# Patient Record
Sex: Female | Born: 1970 | Race: Black or African American | Hispanic: No | Marital: Single | Smoking: Never smoker
Health system: Southern US, Community
[De-identification: ages and names within clinical notes are randomized; demographics above are authoritative.]

## PROBLEM LIST (undated history)

## (undated) DIAGNOSIS — R32 Unspecified urinary incontinence: Secondary | ICD-10-CM

## (undated) DIAGNOSIS — K219 Gastro-esophageal reflux disease without esophagitis: Secondary | ICD-10-CM

## (undated) DIAGNOSIS — G459 Transient cerebral ischemic attack, unspecified: Secondary | ICD-10-CM

## (undated) DIAGNOSIS — E039 Hypothyroidism, unspecified: Secondary | ICD-10-CM

## (undated) DIAGNOSIS — F32A Depression, unspecified: Secondary | ICD-10-CM

## (undated) DIAGNOSIS — G4733 Obstructive sleep apnea (adult) (pediatric): Secondary | ICD-10-CM

## (undated) DIAGNOSIS — E0500 Thyrotoxicosis with diffuse goiter without thyrotoxic crisis or storm: Secondary | ICD-10-CM

## (undated) DIAGNOSIS — I517 Cardiomegaly: Secondary | ICD-10-CM

## (undated) DIAGNOSIS — F419 Anxiety disorder, unspecified: Secondary | ICD-10-CM

## (undated) DIAGNOSIS — K859 Acute pancreatitis without necrosis or infection, unspecified: Secondary | ICD-10-CM

## (undated) DIAGNOSIS — F4310 Post-traumatic stress disorder, unspecified: Secondary | ICD-10-CM

## (undated) DIAGNOSIS — M1990 Unspecified osteoarthritis, unspecified site: Secondary | ICD-10-CM

## (undated) DIAGNOSIS — N6032 Fibrosclerosis of left breast: Secondary | ICD-10-CM

---

## 1995-05-31 HISTORY — PX: BREAST REDUCTION SURGERY: SHX8

## 1995-05-31 HISTORY — PX: LIPOSUCTION: SHX10

## 1996-05-30 HISTORY — PX: CHOLECYSTECTOMY: SHX55

## 2002-06-30 DIAGNOSIS — E0500 Thyrotoxicosis with diffuse goiter without thyrotoxic crisis or storm: Secondary | ICD-10-CM | POA: Insufficient documentation

## 2015-05-31 HISTORY — PX: KNEE ARTHROSCOPY: SUR90

## 2018-12-15 ENCOUNTER — Emergency Department (HOSPITAL_COMMUNITY)
Admission: EM | Admit: 2018-12-15 | Discharge: 2018-12-15 | Disposition: A | Discharge status: Home / Self Care | Payer: No Typology Code available for payment source | Attending: Emergency Medicine | Admitting: Emergency Medicine

## 2018-12-15 ENCOUNTER — Encounter (HOSPITAL_COMMUNITY): Payer: Self-pay | Admitting: Emergency Medicine

## 2018-12-15 ENCOUNTER — Emergency Department (HOSPITAL_COMMUNITY): Payer: No Typology Code available for payment source

## 2018-12-15 ENCOUNTER — Other Ambulatory Visit: Payer: Self-pay

## 2018-12-15 DIAGNOSIS — R0602 Shortness of breath: Secondary | ICD-10-CM | POA: Diagnosis not present

## 2018-12-15 DIAGNOSIS — R7989 Other specified abnormal findings of blood chemistry: Secondary | ICD-10-CM

## 2018-12-15 DIAGNOSIS — R002 Palpitations: Secondary | ICD-10-CM

## 2018-12-15 HISTORY — DX: Thyrotoxicosis with diffuse goiter without thyrotoxic crisis or storm: E05.00

## 2018-12-15 HISTORY — DX: Unspecified osteoarthritis, unspecified site: M19.90

## 2018-12-15 LAB — CBC
HCT: 42.1 % (ref 36.0–46.0)
Hemoglobin: 13.6 g/dL (ref 12.0–15.0)
MCH: 30.8 pg (ref 26.0–34.0)
MCHC: 32.3 g/dL (ref 30.0–36.0)
MCV: 95.5 fL (ref 80.0–100.0)
Platelets: 336 10*3/uL (ref 150–400)
RBC: 4.41 MIL/uL (ref 3.87–5.11)
RDW: 14.8 % (ref 11.5–15.5)
WBC: 7.8 10*3/uL (ref 4.0–10.5)
nRBC: 0 % (ref 0.0–0.2)

## 2018-12-15 LAB — BASIC METABOLIC PANEL
Anion gap: 9 (ref 5–15)
BUN: 8 mg/dL (ref 6–20)
CO2: 18 mmol/L — ABNORMAL LOW (ref 22–32)
Calcium: 9.3 mg/dL (ref 8.9–10.3)
Chloride: 109 mmol/L (ref 98–111)
Creatinine, Ser: 1.04 mg/dL — ABNORMAL HIGH (ref 0.44–1.00)
GFR calc Af Amer: >60 mL/min (ref >60)
GFR calc non Af Amer: >60 mL/min (ref >60)
Glucose, Bld: 106 mg/dL — ABNORMAL HIGH (ref 70–99)
Potassium: 3.7 mmol/L (ref 3.5–5.1)
Sodium: 136 mmol/L (ref 135–145)

## 2018-12-15 LAB — TROPONIN I (HIGH SENSITIVITY)
Troponin I (High Sensitivity): 3 ng/L (ref <18)
Troponin I (High Sensitivity): <2 ng/L (ref <18)

## 2018-12-15 LAB — TSH: TSH: 37.444 u[IU]/mL — ABNORMAL HIGH (ref 0.350–4.500)

## 2018-12-15 LAB — I-STAT BETA HCG BLOOD, ED (MC, WL, AP ONLY): I-stat hCG, quantitative: <5 m[IU]/mL (ref <5)

## 2018-12-15 LAB — D-DIMER, QUANTITATIVE: D-Dimer, Quant: 0.63 ug/mL-FEU — ABNORMAL HIGH (ref 0.00–0.50)

## 2018-12-15 LAB — BRAIN NATRIURETIC PEPTIDE: B Natriuretic Peptide: 13.6 pg/mL (ref 0.0–100.0)

## 2018-12-15 LAB — T4, FREE: Free T4: 0.53 ng/dL — ABNORMAL LOW (ref 0.61–1.12)

## 2018-12-15 IMAGING — DX CHEST - 2 VIEW
2 series · 2 of 2 positions shown · non-contrast
Comparison: None.

CLINICAL DATA: Shortness of breath since yesterday. Bilateral leg
swelling for the past 2 weeks. Palpitations for the past week.

EXAM:
CHEST - 2 VIEW

[chest pa]
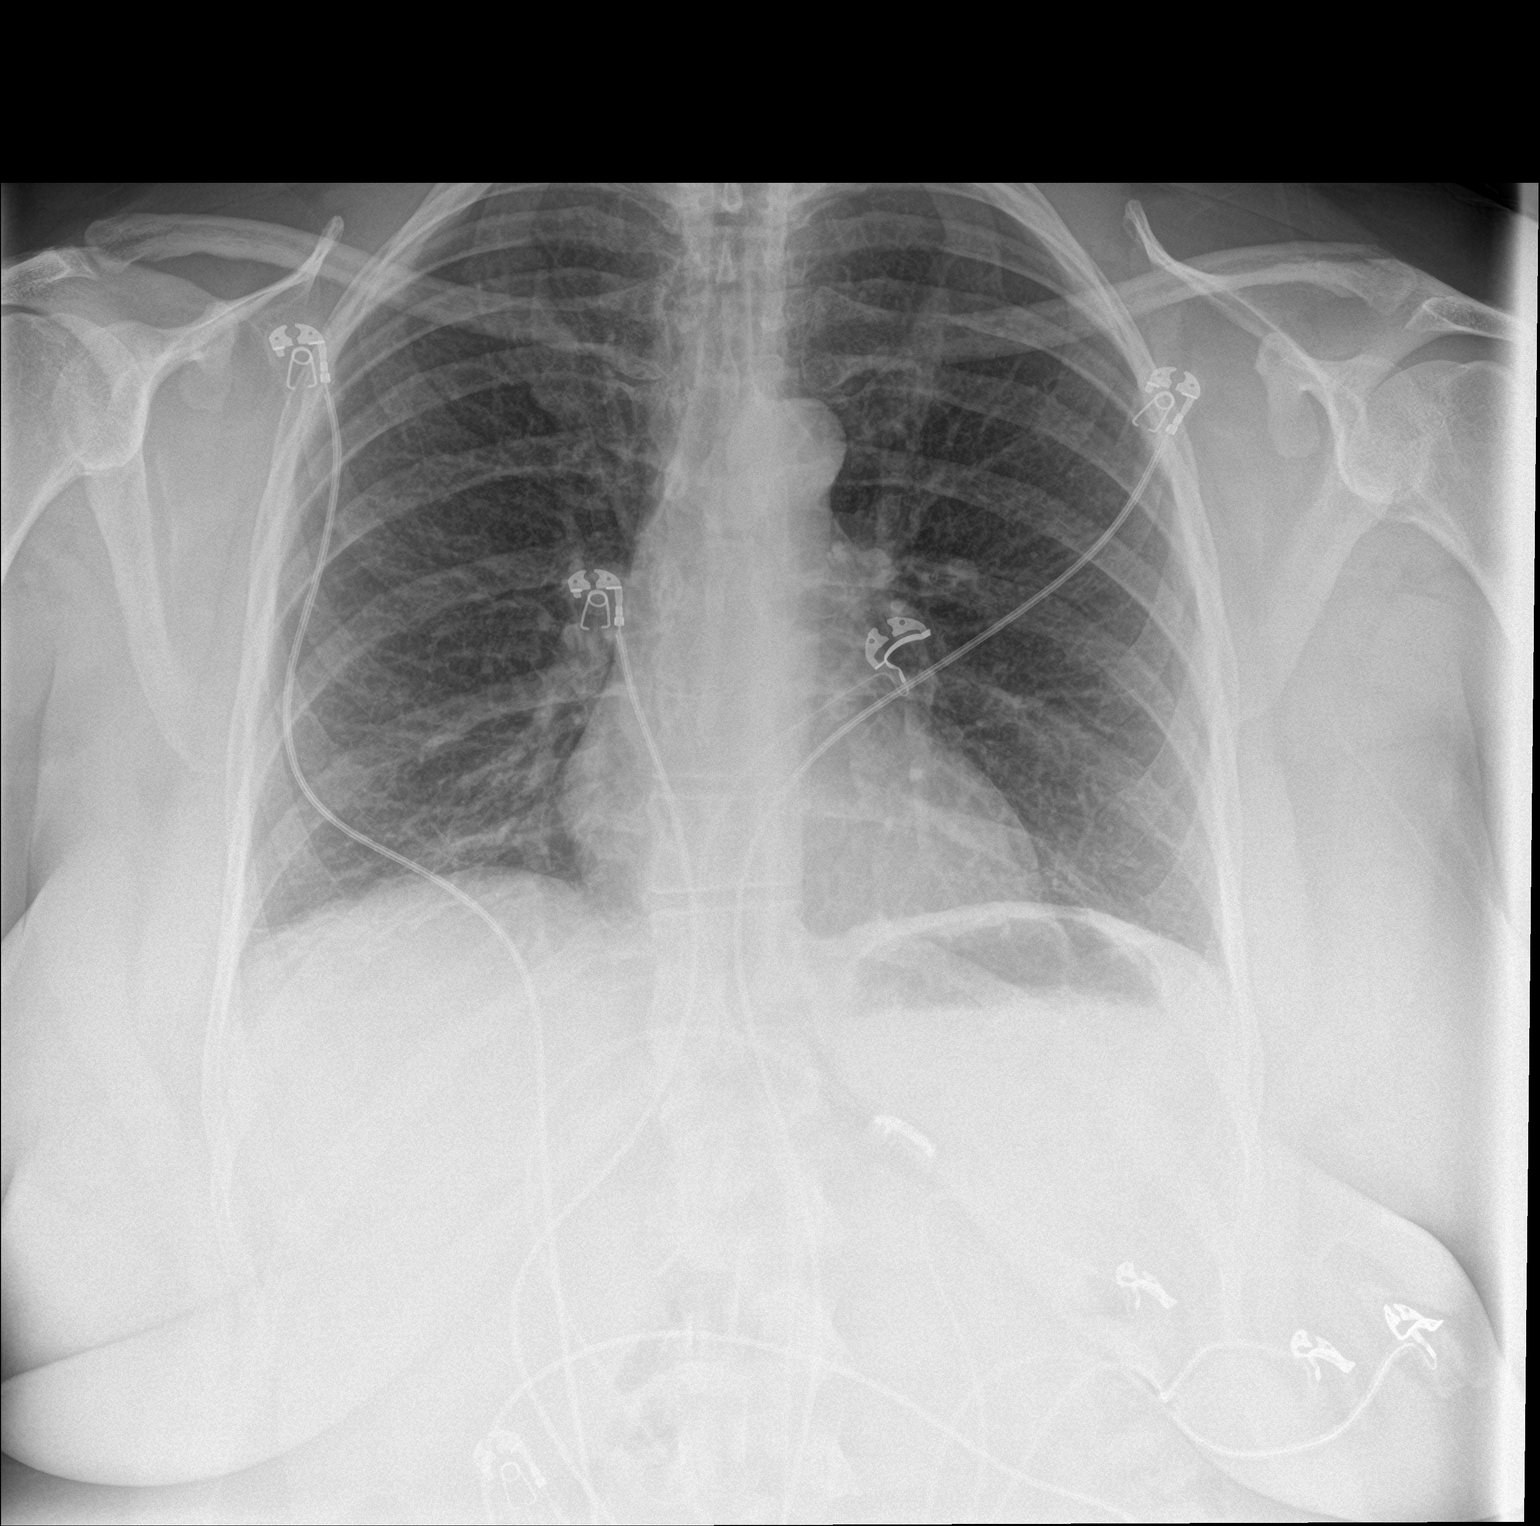

[chest lat]
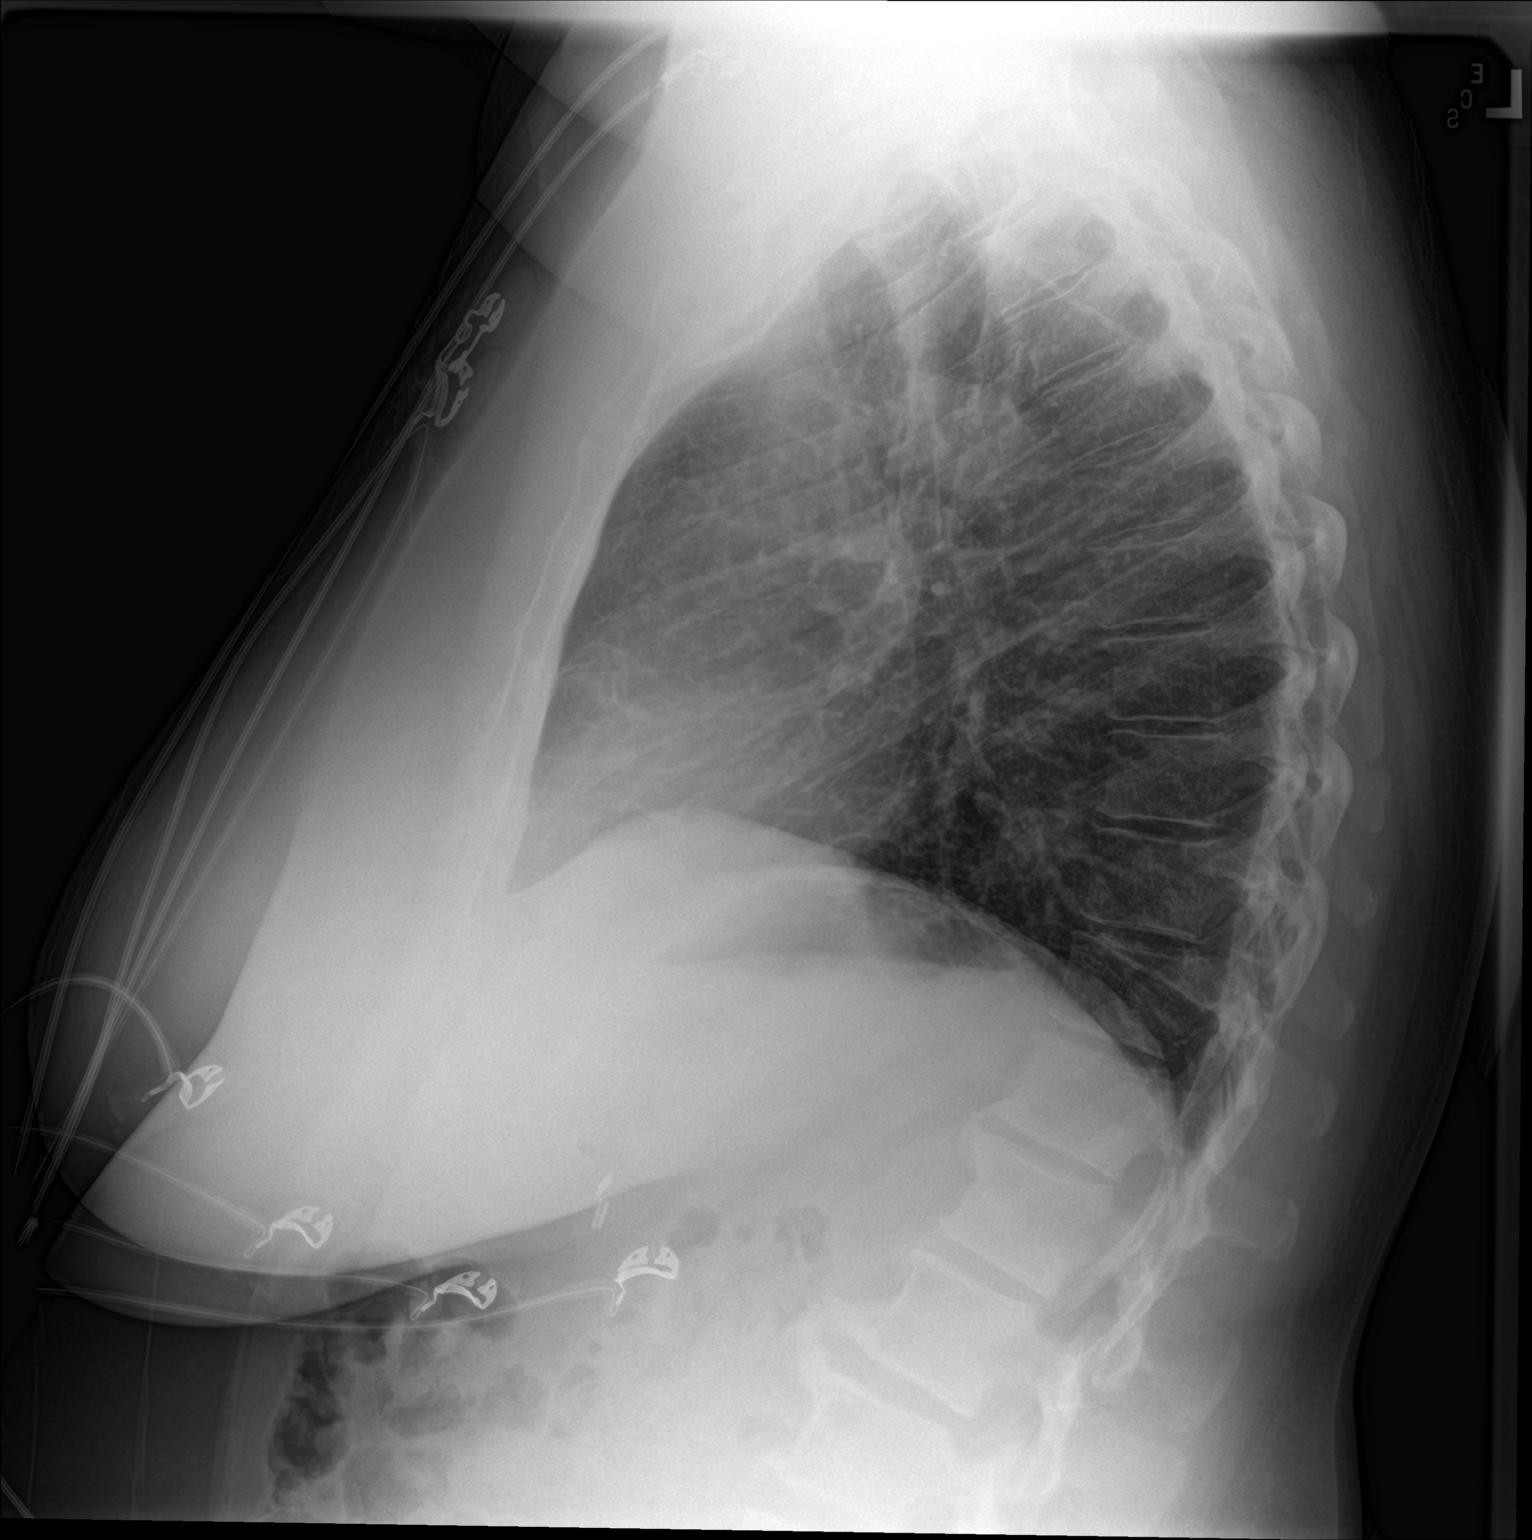

[2 of 2 positions shown; findings below may reference images not displayed]

FINDINGS: Normal sized heart. Minimal linear atelectasis or scarring at the
right lung base. Otherwise, clear lungs with normal vascularity.
Minimal diffuse peribronchial thickening and accentuation of the
interstitial markings. Normal vascularity. No pleural fluid. Minimal
scoliosis. Upper abdominal surgical clips.
IMPRESSION: Minimal bronchitic changes and minimal linear atelectasis or
scarring at the right lung base.

## 2018-12-15 IMAGING — CT CT ANGIOGRAPHY CHEST
2 of 6 series · 19 of 46 positions shown · IV contrast (omnipaque)
Comparison: Chest x-ray today.

CLINICAL DATA: Shortness of breath since yesterday worse with
walking. Some bilateral lower extremity edema.

EXAM:
CT ANGIOGRAPHY CHEST WITH CONTRAST
TECHNIQUE: Multidetector CT imaging of the chest was performed using the
standard protocol during bolus administration of intravenous
contrast. Multiplanar CT image reconstructions and MIPs were
obtained to evaluate the vascular anatomy.
CONTRAST:  100mL OMNIPAQUE IOHEXOL 350 MG/ML SOLN

[Series 6: thins · axial · 0.76mm/px · z∈[+1113,+1374]mm · 16 of 287 slices shown]
[im 13/287  lung]
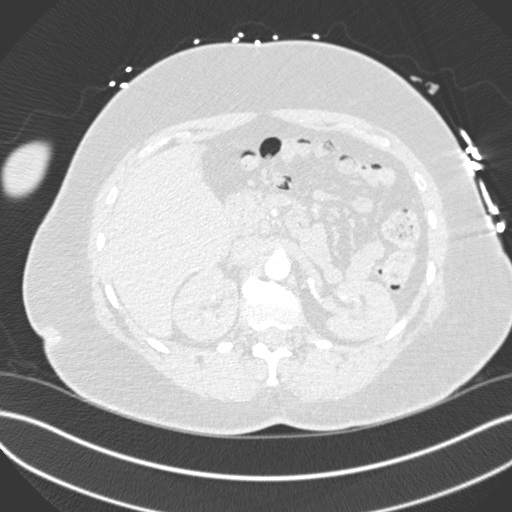
[im 38/287  soft-tissue]
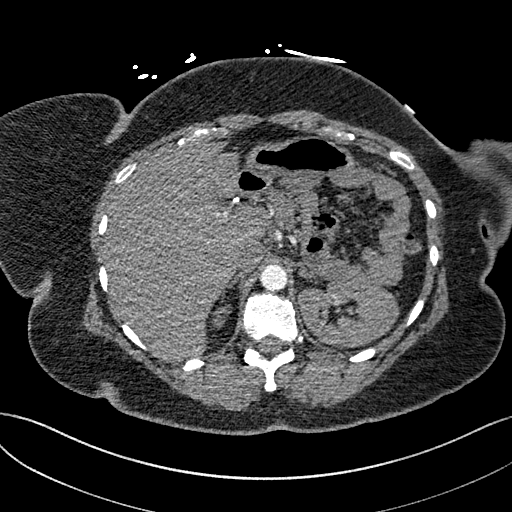
[im 50/287  lung]
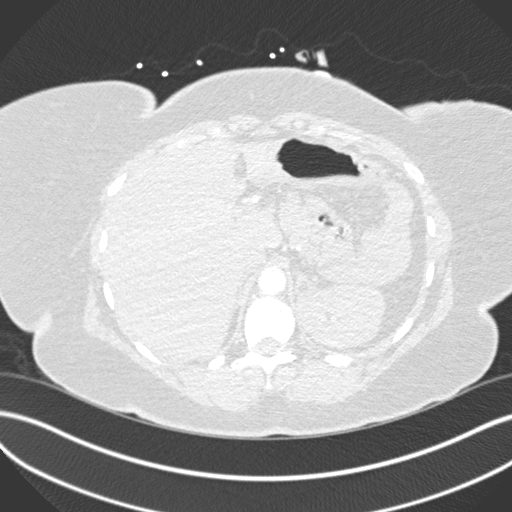
[im 63/287  soft-tissue]
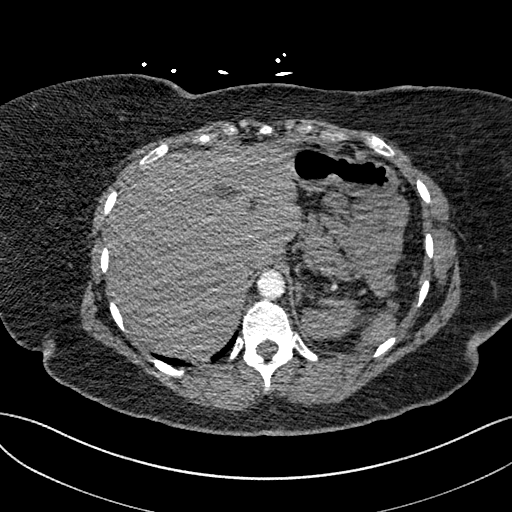
[im 88/287  lung]
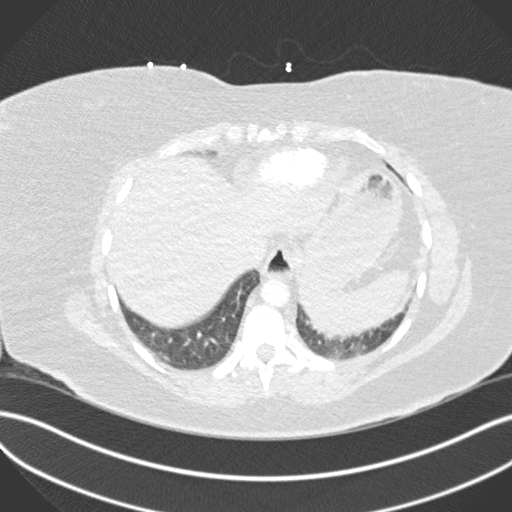
[im 100/287  soft-tissue]
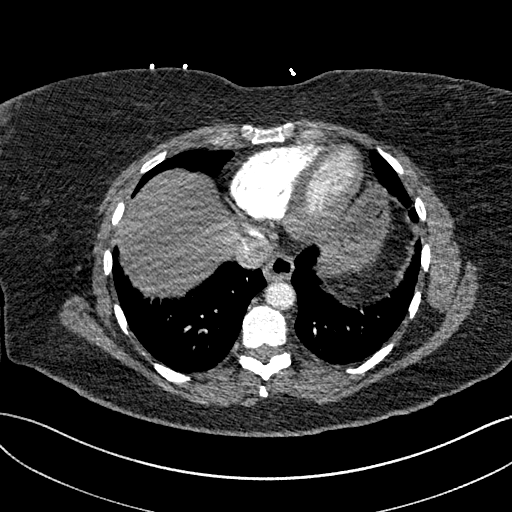
[im 112/287  lung]
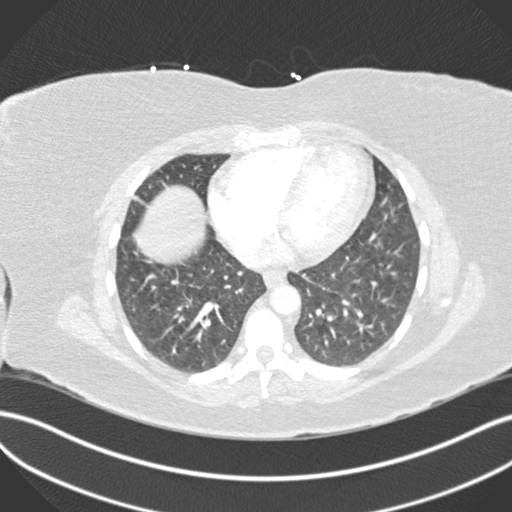
[im 137/287  soft-tissue]
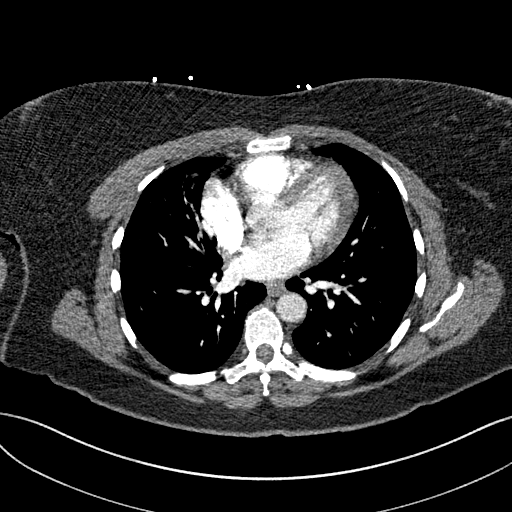
[im 150/287  lung]
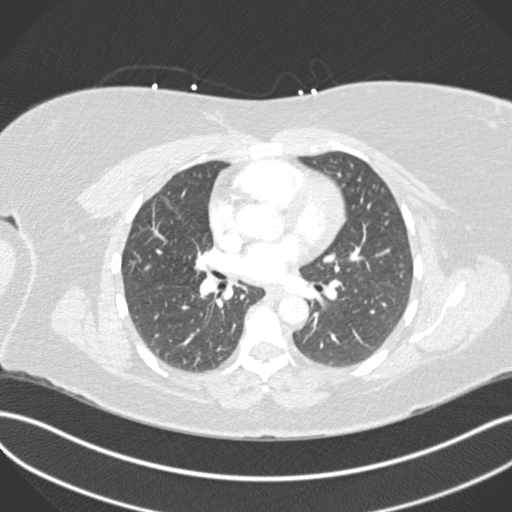
[im 175/287  soft-tissue]
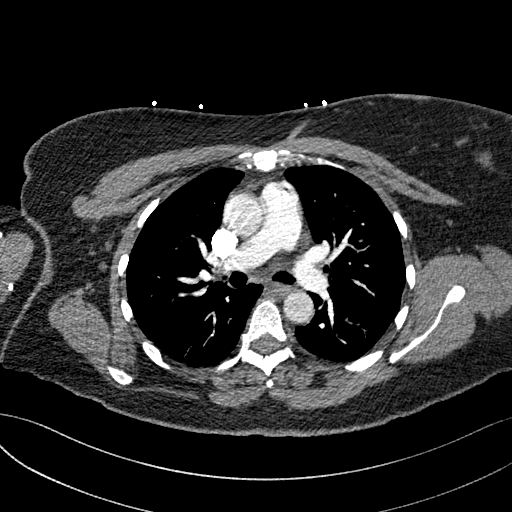
[im 187/287  lung]
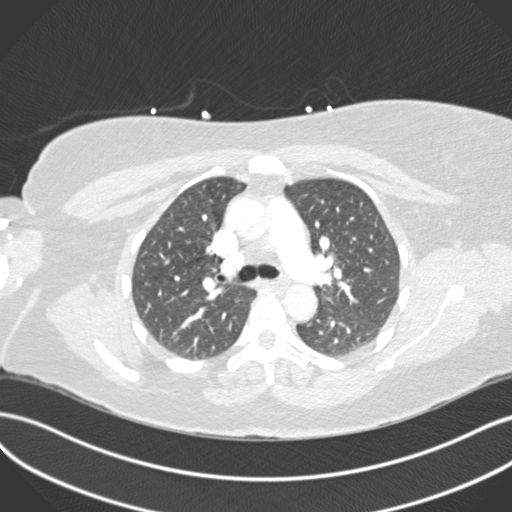
[im 199/287  soft-tissue]
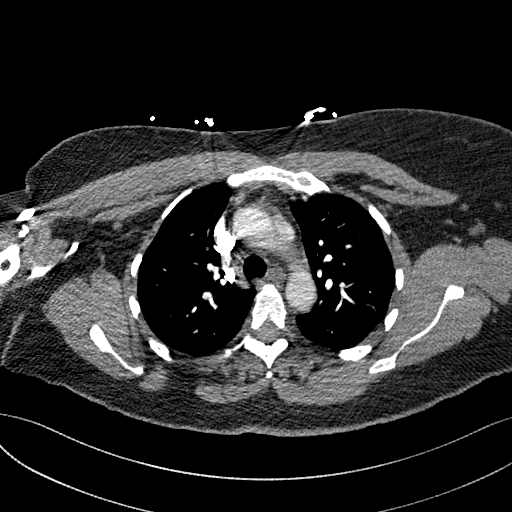
[im 224/287  lung]
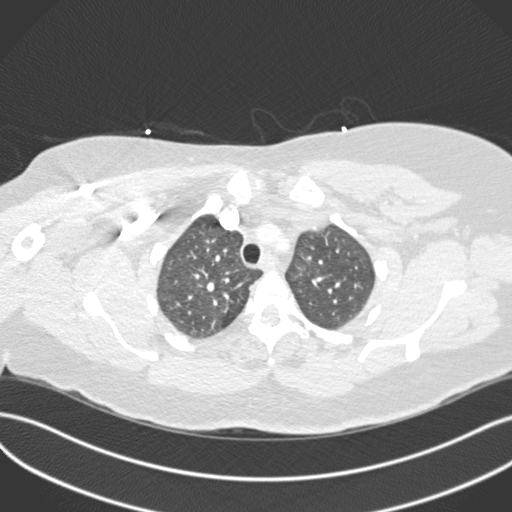
[im 237/287  soft-tissue]
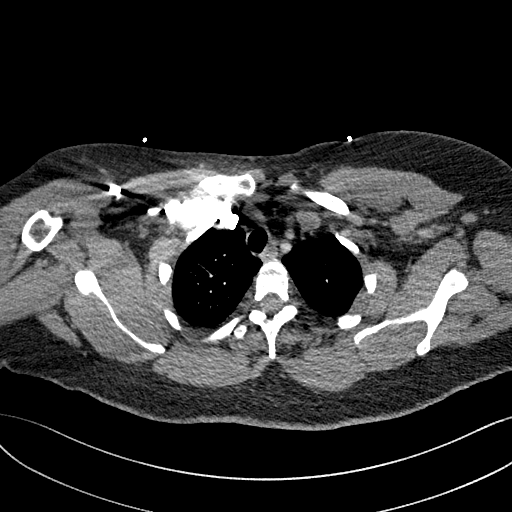
[im 249/287  lung]
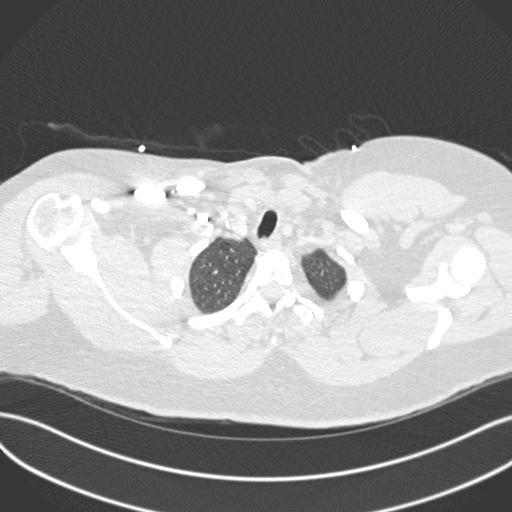
[im 274/287  soft-tissue]
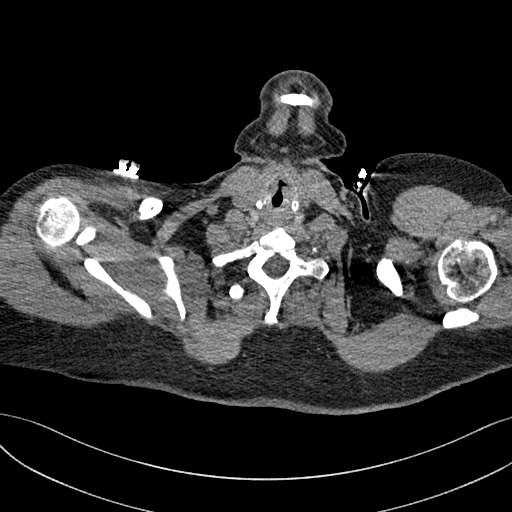

[Series 8: coronal mpr · coronal · 0.59mm/px · 3 of 151 slices shown]
[im 38/151  soft-tissue]
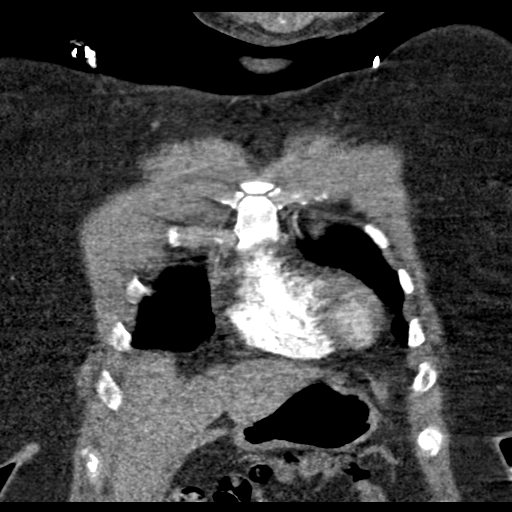
[im 76/151  soft-tissue]
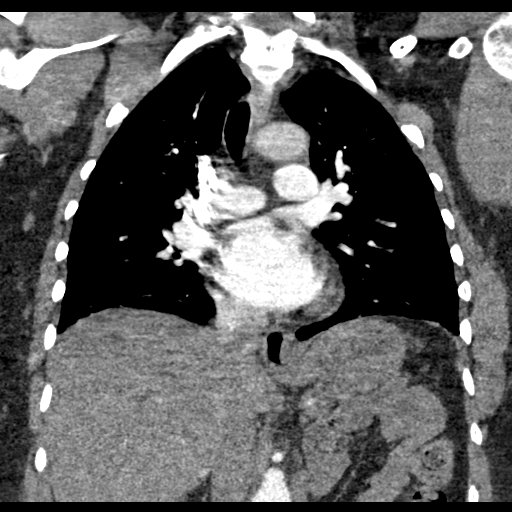
[im 113/151  soft-tissue]
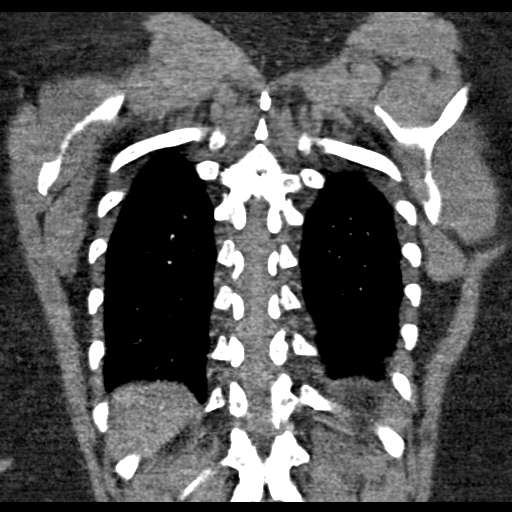

[19 of 46 positions shown; findings below may reference images not displayed]

FINDINGS: Cardiovascular: Heart is normal in size. Pulmonary arterial system
is within normal without evidence of emboli. Thoracic aorta is
normal caliber without aneurysm or dissection. Remaining vascular
structures are unremarkable.

Mediastinum/Nodes: No mediastinal or hilar adenopathy. Possible
small hiatal hernia. Remaining mediastinal structures are within
normal.

Lungs/Pleura: Lungs are adequately inflated without focal airspace
consolidation or effusion. Airways are normal.

Upper Abdomen: 1.7 cm hypodensity over the left lobe of the liver
likely a hemangioma or cyst. No acute findings.

Musculoskeletal: Minimal degenerative change of the spine.

Review of the MIP images confirms the above findings.
IMPRESSION: No evidence of pulmonary embolism. No acute cardiopulmonary disease.

1.7 cm hypodensity over the left lobe of the liver likely a cyst or
hemangioma.

## 2018-12-15 MED ORDER — SODIUM CHLORIDE 0.9% FLUSH: 3.0000 mL | Freq: Once | INTRAVENOUS | Status: DC

## 2018-12-15 MED ORDER — IOHEXOL 350 MG/ML SOLN
100.0000 mL | Freq: Once | INTRAVENOUS | Status: AC | PRN
  Administered 2018-12-15: 100 mL via INTRAVENOUS

## 2018-12-15 NOTE — ED Notes (Signed)
Pt ambulated in hall, pt's pulse ox maintained 98-99%. Pt reported she felt short of breath while ambulating.

## 2018-12-15 NOTE — ED Provider Notes (Signed)
[facility] Provider Note   CSN: [id number] Arrival date & time: 12/15/18  1403    History   Chief Complaint Chief Complaint  Patient presents with  . Shortness of Breath    HPI Natalie Vasquez is a 48 y.o. female h/o GERD, anxiety, migraines, Grave's, OSA on CPAP at night presents to the ER for evaluation of shortness of breath.  Onset earlier today with exertion.  She checked her heart rate and it was in the 120s, SPO2's were normal.  Reports for the last week she has had nighttime palpitations of been progressively getting more noticeable, fatigue.  States at night she can't sleep well because of palpitations and thinks she is not getting enough good sleep causing fatigue. For the last 2 to 3 weeks has noticed feet and ankle swelling, symmetric that she attributes to increased salt intake.  She has been wearing compression stockings which have been significantly improving the swelling.  Does noticed that the extremity swelling is worse at the end of the day and with prolonged standing, much better with elevation.  Has been noticing intermittent hair loss, coldness however has been compliant with Synthroid as prescribed, she went to her PCP and had her thyroid levels checked 2 to 3 days ago and worse told that they were normal.  No recent illnesses.  She denies associated chest pain or discomfort, lightheadedness, presyncope/syncope, nausea, vomiting.  Her bed has an adjustable headboard but denies any changes to this, orthopnea, PND.  No fever, chills, cough.  No known personal cardiac history.  No history of DVT/PE, use of hormone therapy, recent surgery or immobilization, cancer, calf pain. HPI  Past Medical History:  Diagnosis Date  . Arthritis   . Graves disease     There are no active problems to display for this patient.   History reviewed. No pertinent surgical history.   OB History   No obstetric history on file.      Home  Medications    Prior to Admission medications   Not on File    Family History No family history on file.  Social History Social History   Tobacco Use  . Smoking status: Not on file  Substance Use Topics  . Alcohol use: Not on file  . Drug use: Not on file     Allergies   Patient has no known allergies.   Review of Systems Review of Systems  Respiratory: Positive for shortness of breath.   Cardiovascular: Positive for palpitations and leg swelling.  Endocrine: Positive for cold intolerance.       Hair loss   All other systems reviewed and are negative.    Physical Exam Updated Vital Signs BP 108/83   Pulse 71   Temp 98.5 F (36.9 C)   Resp 18   LMP 12/08/2018 (Approximate)   SpO2 96%   Physical Exam Vitals signs and nursing note reviewed.  Constitutional:      General: She is not in acute distress.    Appearance: She is well-developed.     Comments: NAD.  HENT:     Head: Normocephalic and atraumatic.     Right Ear: External ear normal.     Left Ear: External ear normal.     Nose: Nose normal.  Eyes:     General: No scleral icterus.    Conjunctiva/sclera: Conjunctivae normal.  Neck:     Musculoskeletal: Normal range of motion and neck supple.  Cardiovascular:     Rate  and Rhythm: Normal rate and regular rhythm.     Heart sounds: Normal heart sounds. No murmur.     Comments: 1+ radial and DP pulses bilaterally.  Trace nonpitting edema to the feet/ankles, symmetric.  No calf tenderness. Pulmonary:     Effort: Pulmonary effort is normal.     Breath sounds: Decreased breath sounds present.     Comments: Diminished lung entry to the lower lobes anteriorly, difficult exam due to body habitus and breasts.  Lungs clear posteriorly.  No crackles, wheezing. Musculoskeletal: Normal range of motion.        General: No deformity.  Skin:    General: Skin is warm and dry.     Capillary Refill: Capillary refill takes less than 2 seconds.  Neurological:      Mental Status: She is alert and oriented to person, place, and time.  Psychiatric:        Behavior: Behavior normal.        Thought Content: Thought content normal.        Judgment: Judgment normal.      ED Treatments / Results  Labs (all labs ordered are listed, but only abnormal results are displayed) Labs Reviewed  BASIC METABOLIC PANEL - Abnormal; Notable for the following components:      Result Value   CO2 18 (*)    Glucose, Bld 106 (*)    Creatinine, Ser 1.04 (*)    All other components within normal limits  TSH - Abnormal; Notable for the following components:   TSH 37.444 (*)    All other components within normal limits  T4, FREE - Abnormal; Notable for the following components:   Free T4 0.53 (*)    All other components within normal limits  D-DIMER, QUANTITATIVE (NOT AT [facility]) - Abnormal; Notable for the following components:   D-Dimer, Quant 0.63 (*)    All other components within normal limits  CBC  BRAIN NATRIURETIC PEPTIDE  I-STAT BETA HCG BLOOD, ED ([facility], [facility], [facility] ONLY)  TROPONIN I (HIGH SENSITIVITY)  TROPONIN I (HIGH SENSITIVITY)    EKG None  Radiology Dg Chest 2 View  Result Date: 12/15/2018 CLINICAL DATA:  Shortness of breath since yesterday. Bilateral leg swelling for the past 2 weeks. Palpitations for the past week. EXAM: CHEST - 2 VIEW COMPARISON:  None. FINDINGS: Normal sized heart. Minimal linear atelectasis or scarring at the right lung base. Otherwise, clear lungs with normal vascularity. Minimal diffuse peribronchial thickening and accentuation of the interstitial markings. Normal vascularity. No pleural fluid. Minimal scoliosis. Upper abdominal surgical clips. IMPRESSION: Minimal bronchitic changes and minimal linear atelectasis or scarring at the right lung base. Electronically Signed   By: Beckie SaltsSteven  Reid M.D.   On: 12/15/2018 15:31   Ct Angio Chest Pe W And/or Wo Contrast  Result Date: 12/15/2018 CLINICAL DATA:  Shortness of breath since yesterday  worse with walking. Some bilateral lower extremity edema. EXAM: CT ANGIOGRAPHY CHEST WITH CONTRAST TECHNIQUE: Multidetector CT imaging of the chest was performed using the standard protocol during bolus administration of intravenous contrast. Multiplanar CT image reconstructions and MIPs were obtained to evaluate the vascular anatomy. CONTRAST:  100mL OMNIPAQUE IOHEXOL 350 MG/ML SOLN COMPARISON:  Chest x-ray today. FINDINGS: Cardiovascular: Heart is normal in size. Pulmonary arterial system is within normal without evidence of emboli. Thoracic aorta is normal caliber without aneurysm or dissection. Remaining vascular structures are unremarkable. Mediastinum/Nodes: No mediastinal or hilar adenopathy. Possible small hiatal hernia. Remaining mediastinal structures are within normal. Lungs/Pleura: Lungs are  adequately inflated without focal airspace consolidation or effusion. Airways are normal. Upper Abdomen: 1.7 cm hypodensity over the left lobe of the liver likely a hemangioma or cyst. No acute findings. Musculoskeletal: Minimal degenerative change of the spine. Review of the MIP images confirms the above findings. IMPRESSION: No evidence of pulmonary embolism. No acute cardiopulmonary disease. 1.7 cm hypodensity over the left lobe of the liver likely a cyst or hemangioma. Electronically Signed   By: Marin Olp M.D.   On: 12/15/2018 20:32    Procedures Procedures (including critical care time)  Medications Ordered in ED Medications  sodium chloride flush (NS) 0.9 % injection 3 mL (has no administration in time range)  iohexol (OMNIPAQUE) 350 MG/ML injection 100 mL (100 mLs Intravenous Contrast Given 12/15/18 2000)     Initial Impression / Assessment and Plan / ED Course  I have reviewed the triage vital signs and the nursing notes.  Pertinent labs & imaging results that were available during my care of the patient were reviewed by me and considered in my medical decision making (see chart for  details).  Clinical Course as of Dec 14 2041  Sat Dec 15, 2018  1540 IMPRESSION: Minimal bronchitic changes and minimal linear atelectasis or scarring at the right lung base.   DG Chest 2 View [CG]  1711 TSH(!): 37.444 [CG]  1712 T4,Free(Direct)(!): 0.53 [CG]  1712 D-Dimer, Quant(!): 0.63 [CG]  2038 No evidence of pulmonary embolism. No acute cardiopulmonary disease.  1.7 cm hypodensity over the left lobe of the liver likely a cyst or hemangioma.   CT Angio Chest PE W and/or Wo Contrast [CG]    Clinical Course User Index [CG] Kinnie Feil, PA-C   48 year old is here with exertional shortness of breath, nighttime palpitations.  History of Graves' disease compliant on Synthroid.  Reports mild symmetric bilateral lower extremity edema that improves with compression stocking, elevation.  No associated infectious symptoms, cough, chest pain, calf pain.  DDX includes electrolyte abnormalities, symptomatic anemia.  She has very mild symmetric ankle swelling but no orthopnea, PND, history of heart failure or risk factors for this, respiratory distress, crackles on exam and new onset CHF is unlikely.  PE is considered less likely, her Wells score is low and she has no chest pain, hypoxemia.  She has no chest pain and ACS is unlikely.  Your work-up reviewed and interpreted by me, radiology.  TSH/T4 slightly abnormal, no signs of thyroid storm or emergency to warrant immediate adjustment of her medicines and will defer this to primary care team.  D-dimer was 0.63 and CT angiography was obtained which was negative for PE other cardiothoracic evaluation is normal on CT as well.  Troponin unremarkable.  Patient ambulated in the ER and reported mild shortness of breath with exertion but had normal SPO2 and no tachycardia.  Etiology of symptoms is unclear but given benign work-up here doubt life-threatening etiology.  Discussed this with patient and recommended follow-up with PCP. She may  benefit from cardiology for evaluation of palpitations and exertional SOB. Graves and abnormal thyroid labs today could be contributing.  Strict return precautions given.  Patient is comfortable with this plan. Final Clinical Impressions(s) / ED Diagnoses   Final diagnoses:  Shortness of breath  Abnormal thyroid blood test  Palpitations    ED Discharge Orders    None       Arlean Hopping 12/15/18 2043    Daleen Bo, MD 12/17/18 1045

## 2018-12-15 NOTE — ED Notes (Signed)
Patient is alert and orientedx4.  Patient was explained discharge instructions and they understood them with no questions.   

## 2018-12-15 NOTE — ED Notes (Signed)
RN attempted for IV x1  without success.

## 2018-12-15 NOTE — Discharge Instructions (Signed)
You were seen in the ER today for palpitations and shortness of breath.  Lab work, chest x-ray, EKG, heart enzymes, CT of your chest are all reassuring.  There is no evidence of cardiac injury, pulmonary embolism, anemia, electrolyte abnormalities, infection.  Your thyroid levels were slightly abnormal which could be from your Graves' disease and may warrant medication adjustment that can be done by your primary care doctor.  The cause of your shortness of breath and palpitations is still unclear, I recommend close primary care follow-up and possibly cardiology follow-up for further discussion of your symptoms

## 2018-12-15 NOTE — ED Triage Notes (Signed)
Pt endorses sob since yesterday, worse with walking. Reports she has also noticed some edema in bilateral lower extremities, denies cp, fever, chills. Hx of graves disease, had recent blood work a few days ago that only showed thyroid abnormalities.

## 2019-09-02 ENCOUNTER — Ambulatory Visit: Payer: No Typology Code available for payment source | Attending: Internal Medicine

## 2019-09-02 DIAGNOSIS — Z20822 Contact with and (suspected) exposure to covid-19: Secondary | ICD-10-CM

## 2019-09-03 LAB — SARS-COV-2, NAA 2 DAY TAT

## 2019-09-03 LAB — NOVEL CORONAVIRUS, NAA: SARS-CoV-2, NAA: NOT DETECTED

## 2019-10-18 ENCOUNTER — Encounter (HOSPITAL_COMMUNITY): Payer: Self-pay | Admitting: Emergency Medicine

## 2019-10-18 ENCOUNTER — Other Ambulatory Visit: Payer: Self-pay

## 2019-10-18 ENCOUNTER — Emergency Department (HOSPITAL_COMMUNITY)
Admission: EM | Admit: 2019-10-18 | Discharge: 2019-10-19 | Disposition: A | Discharge status: Home / Self Care | Payer: No Typology Code available for payment source | Attending: Emergency Medicine | Admitting: Emergency Medicine

## 2019-10-18 DIAGNOSIS — S39012A Strain of muscle, fascia and tendon of lower back, initial encounter: Secondary | ICD-10-CM

## 2019-10-18 DIAGNOSIS — Y92018 Other place in single-family (private) house as the place of occurrence of the external cause: Secondary | ICD-10-CM | POA: Diagnosis not present

## 2019-10-18 DIAGNOSIS — Y998 Other external cause status: Secondary | ICD-10-CM | POA: Diagnosis not present

## 2019-10-18 DIAGNOSIS — Y9389 Activity, other specified: Secondary | ICD-10-CM | POA: Diagnosis not present

## 2019-10-18 DIAGNOSIS — X509XXA Other and unspecified overexertion or strenuous movements or postures, initial encounter: Secondary | ICD-10-CM | POA: Insufficient documentation

## 2019-10-18 DIAGNOSIS — S3992XA Unspecified injury of lower back, initial encounter: Secondary | ICD-10-CM | POA: Diagnosis present

## 2019-10-18 MED ORDER — KETOROLAC TROMETHAMINE 30 MG/ML IJ SOLN
60.0000 mg | Freq: Once | INTRAMUSCULAR | Status: AC
  Administered 2019-10-18: 60 mg via INTRAMUSCULAR
  Filled 2019-10-18 (×2): qty 2

## 2019-10-18 MED ORDER — HYDROMORPHONE HCL 1 MG/ML IJ SOLN
1.0000 mg | Freq: Once | INTRAMUSCULAR | Status: AC
  Administered 2019-10-18: 1 mg via INTRAMUSCULAR
  Filled 2019-10-18: qty 1

## 2019-10-18 MED ORDER — ONDANSETRON 4 MG PO TBDP
4.0000 mg | ORAL_TABLET | Freq: Once | ORAL | Status: AC
  Administered 2019-10-18: 4 mg via ORAL
  Filled 2019-10-18: qty 1

## 2019-10-18 NOTE — ED Provider Notes (Signed)
TIME SEEN: 11:17 PM  CHIEF COMPLAINT: Back pain  HPI: Patient is a 49 year old female with history of Graves' disease, obesity who presents to the emergency department with left-sided lower back pain.  Symptoms started tonight after lifting a case of water in her mother's house.  She states she lifted the water and then tripped over something on the swelling to the ground.  States this caused her to twist her back and she is now having left-sided back pain.  States she did not fall to the ground.  Having a hard time moving around, walking due to severe pain.  No midline pain.  Denies numbness, tingling, weakness, bowel or bladder incontinence, fever.  No previous history of back surgery or epidural injections.  Came in by EMS.  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  ROS: See HPI Constitutional: no fever  Eyes: no drainage  ENT: no runny nose   Cardiovascular:  no chest pain  Resp: no SOB  GI: no vomiting GU: no dysuria Integumentary: no rash  Allergy: no hives  Musculoskeletal: no leg swelling  Neurological: no slurred speech ROS otherwise negative  PAST MEDICAL HISTORY/PAST SURGICAL HISTORY:  Past Medical History:  Diagnosis Date  . Arthritis   . Graves disease     MEDICATIONS:  Prior to Admission medications   Not on File    ALLERGIES:  Allergies  Allergen Reactions  . Metoclopramide Other (See Comments)    Dystonia reaction    SOCIAL HISTORY:  Social History   Tobacco Use  . Smoking status: Not on file  Substance Use Topics  . Alcohol use: Not on file    FAMILY HISTORY: No family history on file.  EXAM: BP (!) 133/104   Pulse 75   Temp 98.4 F (36.9 C) (Oral)   Resp 20   SpO2 96%  CONSTITUTIONAL: Alert and oriented and responds appropriately to questions.  Tearful, appears uncomfortable HEAD: Normocephalic EYES: Conjunctivae clear, pupils appear equal, EOM appear intact ENT: normal nose; moist mucous membranes NECK: Supple, normal ROM CARD: RRR;  S1 and S2 appreciated; no murmurs, no clicks, no rubs, no gallops RESP: Normal chest excursion without splinting or tachypnea; breath sounds clear and equal bilaterally; no wheezes, no rhonchi, no rales, no hypoxia or respiratory distress, speaking full sentences ABD/GI: Normal bowel sounds; non-distended; soft, non-tender, no rebound, no guarding, no peritoneal signs, no hepatosplenomegaly BACK:  The back appears normal, no midline spinal tenderness or step-off or deformity, tender to palpation over the lumbar paraspinal muscles on the left side without redness, warmth, ecchymosis or soft tissue swelling, no rash or other lesions present EXT: Normal ROM in all joints; no deformity noted, no edema; no cyanosis SKIN: Normal color for age and race; warm; no rash on exposed skin NEURO: Moves all extremities equally, reports normal sensation diffusely, normal speech PSYCH: The patient's mood and manner are appropriate.   MEDICAL DECISION MAKING: Patient here with back pain.  Suspect muscle strain, spasm.  Doubt fracture, cauda equina, epidural abscess or hematoma, discitis or osteomyelitis, transverse myelitis.  I do not feel at this time she needs emergent imaging.  Will treat with IM Dilaudid, Toradol.  She denies any history of chronic kidney disease.  Last creatinine in 2020 was normal.   ED PROGRESS: Patient reports feeling much better and is now able to move around in the bed, stand up on her own and walk without difficulty.  Will discharge with prescription of Percocet.  She has been told  to avoid NSAIDs at this time due to surgery but she did receive a dose of Toradol here prior to me realizing that she had this upcoming surgery.  She states she will talk to her surgeon about this.  Will discharge with Robaxin as well.  Mother here to drive her home.   [place] controlled substance reporting database, patient received 180 mL of hydrocodone on 10/11/2019 for an upcoming weight loss surgery on  Thursday.  States she will not take this until the procedure.   At this time, I do not feel there is any life-threatening condition present. I have reviewed, interpreted and discussed all results (EKG, imaging, lab, urine as appropriate) and exam findings with patient/family. I have reviewed nursing notes and appropriate previous records.  I feel the patient is safe to be discharged home without further emergent workup and can continue workup as an outpatient as needed. Discussed usual and customary return precautions. Patient/family verbalize understanding and are comfortable with this plan.  Outpatient follow-up has been provided as needed. All questions have been answered.    Decarla Siemen was evaluated in Emergency Department on 10/18/2019 for the symptoms described in the history of present illness. She was evaluated in the context of the global COVID-19 pandemic, which necessitated consideration that the patient might be at risk for infection with the SARS-CoV-2 virus that causes COVID-19. Institutional protocols and algorithms that pertain to the evaluation of patients at risk for COVID-19 are in a state of rapid change based on information released by regulatory bodies including the CDC and federal and state organizations. These policies and algorithms were followed during the patient's care in the ED.      Ramona Slinger, Delice Bison, DO 10/19/19 [phone]

## 2019-10-18 NOTE — ED Triage Notes (Signed)
Patient presents via EMS for lower left back pain. Patient was lifting a case of water bottles when she twisted wrong. Patient states she hurt her left lower back and it is spasming. Patient states no fall. Patient states she can't walk because of the pain.

## 2019-10-19 MED ORDER — METHOCARBAMOL 500 MG PO TABS: 500.0000 mg | ORAL_TABLET | Freq: Three times a day (TID) | ORAL | 0 refills | Status: DC | PRN

## 2019-10-19 MED ORDER — OXYCODONE-ACETAMINOPHEN 5-325 MG PO TABS: 2.0000 | ORAL_TABLET | Freq: Four times a day (QID) | ORAL | 0 refills | Status: DC | PRN

## 2019-10-19 NOTE — Discharge Instructions (Addendum)
You were given a dose of Dilaudid which is an opiate pain medication in the emergency department and were provided with a prescription of Percocet which is also an opiate pain medication.  These medications will show up in a standard drug screen.  You also a dose of Toradol here in the emergency department but no other anti-inflammatories.  Please discuss this with your surgeon prior to your surgery on Thursday.  This medicine was given to you prior to physician knowledge of this upcoming surgical procedure.  You are provided with prescription for Robaxin as well which is a muscle relaxer.  I recommend that she wait several hours in between taking Robaxin and Percocet as both of these medications may make you drowsy.   You are being provided a prescription for opiates (also known as narcotics) for pain control.  Opiates can be addictive and should only be used when absolutely necessary for pain control when other alternatives do not work.  We recommend you only use them for the recommended amount of time and only as prescribed.  Please do not take with other sedative medications or alcohol.  Please do not drive, operate machinery, make important decisions while taking opiates.  Please note that these medications can be addictive and have high abuse potential.  Patients can become addicted to narcotics after only taking them for a few days.  Please keep these medications locked away from children, teenagers or any family members with history of substance abuse.  Narcotic pain medicine may also make you constipated.  You may use over-the-counter medications such as MiraLAX, Colace to prevent constipation.  If you become constipated you may use over-the-counter enemas as needed.  Itching and nausea are common side effects of narcotic pain medication.  If you develop uncontrolled vomiting or a rash, please stop these medications.

## 2019-10-21 ENCOUNTER — Ambulatory Visit: Payer: No Typology Code available for payment source | Attending: Internal Medicine

## 2019-10-21 DIAGNOSIS — Z20822 Contact with and (suspected) exposure to covid-19: Secondary | ICD-10-CM

## 2019-10-22 ENCOUNTER — Telehealth: Payer: Self-pay | Admitting: *Deleted

## 2019-10-22 LAB — NOVEL CORONAVIRUS, NAA: SARS-CoV-2, NAA: NOT DETECTED

## 2019-10-22 LAB — SARS-COV-2, NAA 2 DAY TAT

## 2019-10-22 NOTE — Telephone Encounter (Signed)
Patient called given negative covid results . 

## 2020-01-30 ENCOUNTER — Ambulatory Visit: Payer: Self-pay | Attending: Internal Medicine

## 2020-01-30 DIAGNOSIS — Z23 Encounter for immunization: Secondary | ICD-10-CM

## 2020-01-30 NOTE — Progress Notes (Signed)
   Covid-19 Vaccination Clinic  Name:  Monik Lins    MRN: [id number] DOB: [birth year]  01/30/2020  Ms. Dohn was observed post Covid-19 immunization for 15 minutes without incident. She was provided with Vaccine Information Sheet and instruction to access the V-Safe system.   Ms. Postell was instructed to call [phone] with any severe reactions post vaccine: Marland Kitchen Difficulty breathing  . Swelling of face and throat  . A fast heartbeat  . A bad rash all over body  . Dizziness and weakness

## 2020-10-05 ENCOUNTER — Ambulatory Visit: Payer: No Typology Code available for payment source

## 2021-03-10 ENCOUNTER — Telehealth: Payer: No Typology Code available for payment source | Admitting: Family

## 2021-03-10 NOTE — Progress Notes (Signed)
Attempted to connect with patient multiples times. Unable to connect. Will close visit.   Jannifer Rodney, FNP

## 2021-03-11 ENCOUNTER — Other Ambulatory Visit: Payer: Self-pay

## 2021-03-11 ENCOUNTER — Emergency Department (HOSPITAL_BASED_OUTPATIENT_CLINIC_OR_DEPARTMENT_OTHER): Payer: No Typology Code available for payment source

## 2021-03-11 ENCOUNTER — Encounter (HOSPITAL_BASED_OUTPATIENT_CLINIC_OR_DEPARTMENT_OTHER): Payer: Self-pay

## 2021-03-11 ENCOUNTER — Emergency Department (HOSPITAL_BASED_OUTPATIENT_CLINIC_OR_DEPARTMENT_OTHER)
Admission: EM | Admit: 2021-03-11 | Discharge: 2021-03-11 | Disposition: A | Discharge status: Home / Self Care | Payer: No Typology Code available for payment source | Attending: Emergency Medicine | Admitting: Emergency Medicine

## 2021-03-11 DIAGNOSIS — M549 Dorsalgia, unspecified: Secondary | ICD-10-CM | POA: Diagnosis present

## 2021-03-11 DIAGNOSIS — M545 Low back pain, unspecified: Secondary | ICD-10-CM

## 2021-03-11 DIAGNOSIS — N200 Calculus of kidney: Secondary | ICD-10-CM | POA: Diagnosis not present

## 2021-03-11 DIAGNOSIS — R109 Unspecified abdominal pain: Secondary | ICD-10-CM

## 2021-03-11 DIAGNOSIS — E039 Hypothyroidism, unspecified: Secondary | ICD-10-CM | POA: Insufficient documentation

## 2021-03-11 DIAGNOSIS — Z79899 Other long term (current) drug therapy: Secondary | ICD-10-CM | POA: Insufficient documentation

## 2021-03-11 DIAGNOSIS — R10A0 Flank pain, unspecified side: Secondary | ICD-10-CM

## 2021-03-11 HISTORY — DX: Post-traumatic stress disorder, unspecified: F43.10

## 2021-03-11 HISTORY — DX: Hypothyroidism, unspecified: E03.9

## 2021-03-11 HISTORY — DX: Acute pancreatitis without necrosis or infection, unspecified: K85.90

## 2021-03-11 HISTORY — DX: Obstructive sleep apnea (adult) (pediatric): G47.33

## 2021-03-11 HISTORY — DX: Gastro-esophageal reflux disease without esophagitis: K21.9

## 2021-03-11 HISTORY — DX: Anxiety disorder, unspecified: F41.9

## 2021-03-11 HISTORY — DX: Fibrosclerosis of left breast: N60.32

## 2021-03-11 HISTORY — DX: Depression, unspecified: F32.A

## 2021-03-11 HISTORY — DX: Unspecified urinary incontinence: R32

## 2021-03-11 LAB — COMPREHENSIVE METABOLIC PANEL
ALT: 12 U/L (ref 0–44)
AST: 16 U/L (ref 15–41)
Albumin: 3.6 g/dL (ref 3.5–5.0)
Alkaline Phosphatase: 72 U/L (ref 38–126)
Anion gap: 8 (ref 5–15)
BUN: 15 mg/dL (ref 6–20)
CO2: 23 mmol/L (ref 22–32)
Calcium: 8.9 mg/dL (ref 8.9–10.3)
Chloride: 107 mmol/L (ref 98–111)
Creatinine, Ser: 0.8 mg/dL (ref 0.44–1.00)
GFR, Estimated: >60 mL/min (ref >60)
Glucose, Bld: 95 mg/dL (ref 70–99)
Potassium: 3.8 mmol/L (ref 3.5–5.1)
Sodium: 138 mmol/L (ref 135–145)
Total Bilirubin: 0.3 mg/dL (ref 0.3–1.2)
Total Protein: 6.9 g/dL (ref 6.5–8.1)

## 2021-03-11 LAB — CBC WITH DIFFERENTIAL/PLATELET
Abs Immature Granulocytes: 0.01 10*3/uL (ref 0.00–0.07)
Basophils Absolute: 0 10*3/uL (ref 0.0–0.1)
Basophils Relative: 1 %
Eosinophils Absolute: 0.2 10*3/uL (ref 0.0–0.5)
Eosinophils Relative: 4 %
HCT: 36.9 % (ref 36.0–46.0)
Hemoglobin: 11.7 g/dL — ABNORMAL LOW (ref 12.0–15.0)
Immature Granulocytes: 0 %
Lymphocytes Relative: 39 %
Lymphs Abs: 2.6 10*3/uL (ref 0.7–4.0)
MCH: 28.3 pg (ref 26.0–34.0)
MCHC: 31.7 g/dL (ref 30.0–36.0)
MCV: 89.3 fL (ref 80.0–100.0)
Monocytes Absolute: 0.7 10*3/uL (ref 0.1–1.0)
Monocytes Relative: 11 %
Neutro Abs: 3.1 10*3/uL (ref 1.7–7.7)
Neutrophils Relative %: 45 %
Platelets: 305 10*3/uL (ref 150–400)
RBC: 4.13 MIL/uL (ref 3.87–5.11)
RDW: 15.9 % — ABNORMAL HIGH (ref 11.5–15.5)
WBC: 6.6 10*3/uL (ref 4.0–10.5)
nRBC: 0 % (ref 0.0–0.2)

## 2021-03-11 LAB — URINALYSIS, ROUTINE W REFLEX MICROSCOPIC
Bilirubin Urine: NEGATIVE
Glucose, UA: NEGATIVE mg/dL
Hgb urine dipstick: NEGATIVE
Ketones, ur: NEGATIVE mg/dL
Nitrite: NEGATIVE
Protein, ur: NEGATIVE mg/dL
Specific Gravity, Urine: 1.015 (ref 1.005–1.030)
pH: 6 (ref 5.0–8.0)

## 2021-03-11 LAB — LIPASE, BLOOD: Lipase: 31 U/L (ref 11–51)

## 2021-03-11 MED ORDER — HYDROCODONE-ACETAMINOPHEN 5-325 MG PO TABS: 1.0000 | ORAL_TABLET | Freq: Four times a day (QID) | ORAL | 0 refills | Status: DC | PRN

## 2021-03-11 MED ORDER — IOHEXOL 300 MG/ML  SOLN
100.0000 mL | Freq: Once | INTRAMUSCULAR | Status: AC | PRN
  Administered 2021-03-11: 100 mL via INTRAVENOUS

## 2021-03-11 NOTE — ED Provider Notes (Signed)
[facility] EMERGENCY DEPT Provider Note   CSN: [id number] Arrival date & time: 03/11/21  0740     History Chief Complaint  Patient presents with   Back Pain    Natalie Vasquez is a 50 y.o. female.  Patient with a complaint of right-sided back pain right flank pain and right anterior abdominal pain right lower quadrant more than right upper quadrant.  Is been ongoing for 9 days.  Patient states initially she had some urinary frequency urgency and chills which resolved.  Back pain is worse with movement of the right leg.  No numbness or weakness to the right foot.  No prior history of kidney stones.  No nausea vomiting or diarrhea currently.  Patient's had her gallbladder removed.      Past Medical History:  Diagnosis Date   Anxiety    Arthritis    Depression    Fibrosis, breast, left    GERD (gastroesophageal reflux disease)    Graves disease    Hypothyroid    Obstructive sleep apnea    uses CPAP at night   Pancreatitis    PTSD (post-traumatic stress disorder)    Urinary incontinence     There are no problems to display for this patient.   Past Surgical History:  Procedure Laterality Date   BREAST REDUCTION SURGERY Bilateral 1997   CHOLECYSTECTOMY  1998   KNEE ARTHROSCOPY Left 2017   LIPOSUCTION Bilateral 1997   hips and thighs     OB History   No obstetric history on file.     No family history on file.  Social History   Tobacco Use   Smoking status: Never   Smokeless tobacco: Never  Vaping Use   Vaping Use: Never used  Substance Use Topics   Alcohol use: Never   Drug use: Never    Home Medications Prior to Admission medications   Medication Sig Start Date End Date Taking? Authorizing Provider  HYDROcodone-acetaminophen (NORCO/VICODIN) 5-325 MG tablet Take 1 tablet by mouth every 6 (six) hours as needed for moderate pain. 03/11/21  Yes Vanetta Mulders, MD  levothyroxine (SYNTHROID) 75 MCG tablet Take 75 mcg by mouth daily  before breakfast.   Yes [provider]  meloxicam (MOBIC) 15 MG tablet Take 15 mg by mouth 2 (two) times daily.   Yes [provider]  oxybutynin (DITROPAN-XL) 10 MG 24 hr tablet Take 12.5 mg by mouth 2 (two) times daily. Takes 12.5 mg 2x/day   Yes [provider]  pantoprazole (PROTONIX) 40 MG tablet Take 40 mg by mouth daily.   Yes [provider]  QUEtiapine (SEROQUEL) 100 MG tablet Take 5 mg by mouth at bedtime. Takes 5 mg at bedtime for PTSD.   Yes [provider]  traZODone (DESYREL) 100 MG tablet Take 100 mg by mouth at bedtime.   Yes [provider]  methocarbamol (ROBAXIN) 500 MG tablet Take 1 tablet (500 mg total) by mouth every 8 (eight) hours as needed for muscle spasms. Patient not taking: Reported on 03/11/2021 10/19/19   Ward, Layla Maw, DO  oxyCODONE-acetaminophen (PERCOCET/ROXICET) 5-325 MG tablet Take 2 tablets by mouth every 6 (six) hours as needed. Patient not taking: Reported on 03/11/2021 10/19/19   Ward, Layla Maw, DO    Allergies    Metoclopramide  Review of Systems   Review of Systems  Constitutional:  Negative for chills and fever.  HENT:  Negative for ear pain and sore throat.   Eyes:  Negative for pain and  visual disturbance.  Respiratory:  Negative for cough and shortness of breath.   Cardiovascular:  Negative for chest pain and palpitations.  Gastrointestinal:  Positive for abdominal pain. Negative for vomiting.  Genitourinary:  Positive for flank pain. Negative for dysuria and hematuria.  Musculoskeletal:  Positive for back pain. Negative for arthralgias.  Skin:  Negative for color change and rash.  Neurological:  Negative for seizures and syncope.  All other systems reviewed and are negative.  Physical Exam Updated Vital Signs BP 109/67 (BP Location: Right Arm)   Pulse 60   Temp 98 F (36.7 C) (Oral)   Resp 15   Ht 1.626 m (5\' 4" )   Wt 113.4 kg   LMP 02/27/2021   SpO2 100%   BMI 42.91 kg/m    Physical Exam Vitals and nursing note reviewed.  Constitutional:      General: She is not in acute distress.    Appearance: Normal appearance. She is well-developed.  HENT:     Head: Normocephalic and atraumatic.  Eyes:     Extraocular Movements: Extraocular movements intact.     Conjunctiva/sclera: Conjunctivae normal.     Pupils: Pupils are equal, round, and reactive to light.  Cardiovascular:     Rate and Rhythm: Normal rate and regular rhythm.     Heart sounds: No murmur heard. Pulmonary:     Effort: Pulmonary effort is normal. No respiratory distress.     Breath sounds: Normal breath sounds.  Abdominal:     Palpations: Abdomen is soft.     Tenderness: There is abdominal tenderness.     Comments: Also tenderness to palpation kind of right lower quadrant.  And some right flank tenderness.  Musculoskeletal:        General: Normal range of motion.     Cervical back: Neck supple.     Comments: Tenderness to palpation to the right lumbar paraspinous area.  Right lower extremity distally dorsalis pedis pulses 1+.  Good movement of the foot sensation intact to the foot.  Movement of the right lower extremity does cause some pain in the right back and right flank area.  Skin:    General: Skin is warm and dry.  Neurological:     General: No focal deficit present.     Mental Status: She is alert and oriented to person, place, and time.    ED Results / Procedures / Treatments   Labs (all labs ordered are listed, but only abnormal results are displayed) Labs Reviewed  URINALYSIS, ROUTINE W REFLEX MICROSCOPIC - Abnormal; Notable for the following components:      Result Value   APPearance HAZY (*)    Leukocytes,Ua SMALL (*)    Bacteria, UA RARE (*)    All other components within normal limits  CBC WITH DIFFERENTIAL/PLATELET - Abnormal; Notable for the following components:   Hemoglobin 11.7 (*)    RDW 15.9 (*)    All other components within normal limits  COMPREHENSIVE  METABOLIC PANEL  LIPASE, BLOOD    EKG None  Radiology CT Abdomen Pelvis W Contrast  Result Date: 03/11/2021 CLINICAL DATA:  Abdominal abscess/infection suspected. Acute nonlocalized abdominal pain. Right-sided back and flank pain that radiates around the abdomen. EXAM: CT ABDOMEN AND PELVIS WITH CONTRAST TECHNIQUE: Multidetector CT imaging of the abdomen and pelvis was performed using the standard protocol following bolus administration of intravenous contrast. CONTRAST:  03/13/2021 OMNIPAQUE IOHEXOL 300 MG/ML  SOLN COMPARISON:  None. FINDINGS: Lower chest:  Atelectasis at the lung bases. Hepatobiliary:  No focal liver abnormality.Cholecystectomy. No bile duct dilatation. Pancreas: Unremarkable. Spleen: Unremarkable. Adrenals/Urinary Tract: Negative adrenals. No hydronephrosis or ureteral stone. Small calculus in the lower pole left kidney. Unremarkable bladder. Stomach/Bowel: No obstruction. Postoperative stomach. No visible bowel inflammation Vascular/Lymphatic: No acute vascular abnormality. No mass or adenopathy. Reproductive:Presumed dominant follicle on the right measuring 3 cm with simple appearance. Other: No ascites or pneumoperitoneum. Musculoskeletal: No acute abnormalities.  Lumbar spondylosis IMPRESSION: 1. No acute finding. 2. Small left renal calculus. Electronically Signed   By: Tiburcio Pea M.D.   On: 03/11/2021 10:33    Procedures Procedures   Medications Ordered in ED Medications  iohexol (OMNIPAQUE) 300 MG/ML solution 100 mL (100 mLs Intravenous Contrast Given 03/11/21 0950)    ED Course  I have reviewed the triage vital signs and the nursing notes.  Pertinent labs & imaging results that were available during my care of the patient were reviewed by me and considered in my medical decision making (see chart for details).    MDM Rules/Calculators/A&P                          Work-up here today without any acute findings.  May be musculoskeletal.  Certainly no evidence of  any ureteral kidney stone.  There was a dominant follicle in the right ovary possibly could be the cause of the symptoms.  But they did not feel that there was any significant abnormality there.  Will treat with work note rest follow-up with primary care doctor and pain medication.  Final Clinical Impression(s) / ED Diagnoses Final diagnoses:  Flank pain  Acute right-sided low back pain without sciatica    Rx / DC Orders ED Discharge Orders          Ordered    HYDROcodone-acetaminophen (NORCO/VICODIN) 5-325 MG tablet  Every 6 hours PRN        03/11/21 1118             Vanetta Mulders, MD 03/11/21 1120

## 2021-03-11 NOTE — ED Triage Notes (Signed)
Pt arrives POV, c/o right side back/flank pain that radiates around to abdomen x9 days.  States she initially had some urinary frequency, urgency and chills which has resolved.  States back pain is worse with any activity or movement.   Currently denies any fever, chills, urinary symptoms, nausea, vomiting, and diarrhea.

## 2021-03-11 NOTE — Discharge Instructions (Signed)
Make an appointment to follow-up with your primary care doctor.  Today's work-up without any acute findings.  So this may be musculoskeletal back pain.  Take the pain medication as directed.  Return for any new or worse symptoms.  Work note provided.  Rest at home off your feet is much as possible.

## 2021-06-30 DIAGNOSIS — G459 Transient cerebral ischemic attack, unspecified: Secondary | ICD-10-CM | POA: Insufficient documentation

## 2021-07-01 ENCOUNTER — Encounter (HOSPITAL_COMMUNITY): Payer: Self-pay

## 2021-07-01 ENCOUNTER — Emergency Department (HOSPITAL_COMMUNITY)
Admission: EM | Admit: 2021-07-01 | Discharge: 2021-07-01 | Disposition: A | Discharge status: Home / Self Care | Payer: No Typology Code available for payment source | Attending: Emergency Medicine | Admitting: Emergency Medicine

## 2021-07-01 ENCOUNTER — Emergency Department (HOSPITAL_COMMUNITY): Payer: No Typology Code available for payment source

## 2021-07-01 DIAGNOSIS — R202 Paresthesia of skin: Secondary | ICD-10-CM

## 2021-07-01 DIAGNOSIS — R29810 Facial weakness: Secondary | ICD-10-CM | POA: Diagnosis present

## 2021-07-01 DIAGNOSIS — G459 Transient cerebral ischemic attack, unspecified: Secondary | ICD-10-CM

## 2021-07-01 DIAGNOSIS — R400 Somnolence: Secondary | ICD-10-CM | POA: Insufficient documentation

## 2021-07-01 DIAGNOSIS — N9489 Other specified conditions associated with female genital organs and menstrual cycle: Secondary | ICD-10-CM | POA: Insufficient documentation

## 2021-07-01 DIAGNOSIS — R001 Bradycardia, unspecified: Secondary | ICD-10-CM | POA: Insufficient documentation

## 2021-07-01 DIAGNOSIS — F4310 Post-traumatic stress disorder, unspecified: Secondary | ICD-10-CM | POA: Diagnosis not present

## 2021-07-01 DIAGNOSIS — R200 Anesthesia of skin: Secondary | ICD-10-CM

## 2021-07-01 DIAGNOSIS — F419 Anxiety disorder, unspecified: Secondary | ICD-10-CM | POA: Diagnosis not present

## 2021-07-01 DIAGNOSIS — F32A Depression, unspecified: Secondary | ICD-10-CM | POA: Diagnosis not present

## 2021-07-01 DIAGNOSIS — R110 Nausea: Secondary | ICD-10-CM | POA: Diagnosis not present

## 2021-07-01 LAB — COMPREHENSIVE METABOLIC PANEL
ALT: 16 U/L (ref 0–44)
AST: 19 U/L (ref 15–41)
Albumin: 3.2 g/dL — ABNORMAL LOW (ref 3.5–5.0)
Alkaline Phosphatase: 63 U/L (ref 38–126)
Anion gap: 7 (ref 5–15)
BUN: 14 mg/dL (ref 6–20)
CO2: 27 mmol/L (ref 22–32)
Calcium: 9.1 mg/dL (ref 8.9–10.3)
Chloride: 104 mmol/L (ref 98–111)
Creatinine, Ser: 0.86 mg/dL (ref 0.44–1.00)
GFR, Estimated: >60 mL/min (ref >60)
Glucose, Bld: 90 mg/dL (ref 70–99)
Potassium: 3.6 mmol/L (ref 3.5–5.1)
Sodium: 138 mmol/L (ref 135–145)
Total Bilirubin: 0.2 mg/dL — ABNORMAL LOW (ref 0.3–1.2)
Total Protein: 6.6 g/dL (ref 6.5–8.1)

## 2021-07-01 LAB — RAPID URINE DRUG SCREEN, HOSP PERFORMED
Amphetamines: NOT DETECTED
Barbiturates: NOT DETECTED
Benzodiazepines: NOT DETECTED
Cocaine: NOT DETECTED
Opiates: NOT DETECTED
Tetrahydrocannabinol: NOT DETECTED

## 2021-07-01 LAB — CBC
HCT: 37.2 % (ref 36.0–46.0)
Hemoglobin: 11.8 g/dL — ABNORMAL LOW (ref 12.0–15.0)
MCH: 29.6 pg (ref 26.0–34.0)
MCHC: 31.7 g/dL (ref 30.0–36.0)
MCV: 93.2 fL (ref 80.0–100.0)
Platelets: 342 10*3/uL (ref 150–400)
RBC: 3.99 MIL/uL (ref 3.87–5.11)
RDW: 14.6 % (ref 11.5–15.5)
WBC: 7 10*3/uL (ref 4.0–10.5)
nRBC: 0 % (ref 0.0–0.2)

## 2021-07-01 LAB — DIFFERENTIAL
Abs Immature Granulocytes: 0.02 10*3/uL (ref 0.00–0.07)
Basophils Absolute: 0 10*3/uL (ref 0.0–0.1)
Basophils Relative: 1 %
Eosinophils Absolute: 0.2 10*3/uL (ref 0.0–0.5)
Eosinophils Relative: 3 %
Immature Granulocytes: 0 %
Lymphocytes Relative: 48 %
Lymphs Abs: 3.3 10*3/uL (ref 0.7–4.0)
Monocytes Absolute: 0.5 10*3/uL (ref 0.1–1.0)
Monocytes Relative: 7 %
Neutro Abs: 2.9 10*3/uL (ref 1.7–7.7)
Neutrophils Relative %: 41 %

## 2021-07-01 LAB — URINALYSIS, ROUTINE W REFLEX MICROSCOPIC
Bilirubin Urine: NEGATIVE
Glucose, UA: NEGATIVE mg/dL
Hgb urine dipstick: NEGATIVE
Ketones, ur: NEGATIVE mg/dL
Leukocytes,Ua: NEGATIVE
Nitrite: NEGATIVE
Protein, ur: NEGATIVE mg/dL
Specific Gravity, Urine: 1.02 (ref 1.005–1.030)
pH: 7 (ref 5.0–8.0)

## 2021-07-01 LAB — ETHANOL: Alcohol, Ethyl (B): <10 mg/dL (ref <10)

## 2021-07-01 LAB — I-STAT BETA HCG BLOOD, ED (MC, WL, AP ONLY): I-stat hCG, quantitative: <5 m[IU]/mL (ref <5)

## 2021-07-01 LAB — PROTIME-INR
INR: 1.1 (ref 0.8–1.2)
Prothrombin Time: 14 seconds (ref 11.4–15.2)

## 2021-07-01 LAB — CBG MONITORING, ED: Glucose-Capillary: 96 mg/dL (ref 70–99)

## 2021-07-01 LAB — APTT: aPTT: 31 seconds (ref 24–36)

## 2021-07-01 IMAGING — MR MR HEAD W/O CM
6 of 10 series · 29 of 48 positions shown · non-contrast
Comparison: Head CT [DATE]

CLINICAL DATA: Neuro deficit, acute, stroke suspected. Recurrent
facial numbness and stuttering.

EXAM:
MRI HEAD WITHOUT CONTRAST
TECHNIQUE: Multiplanar, multiecho pulse sequences of the brain and surrounding
structures were obtained without intravenous contrast.

[Series 2: DWI · axial · 3.0mm · 0.94mm/px · z∈[-88,+59]mm · 9 of 100 slices shown (1 of 2)]
[im 1/100]
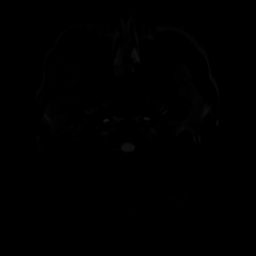
[im 13/100]
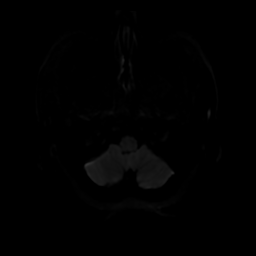
[im 25/100]
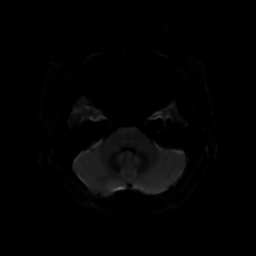
[im 38/100]
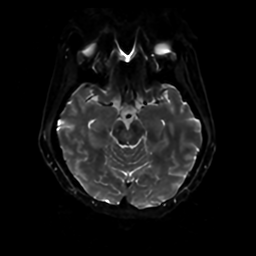
[im 50/100]
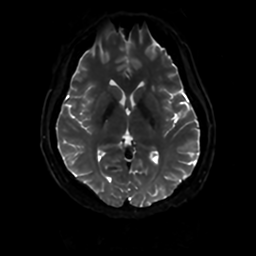
[im 62/100]
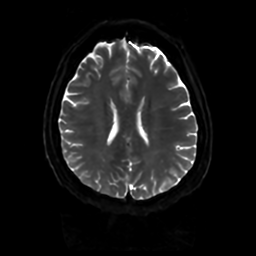
[im 75/100]
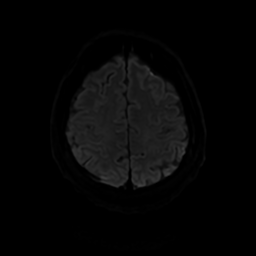
[im 87/100]
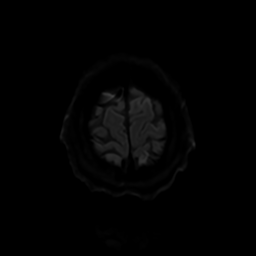
[im 100/100]
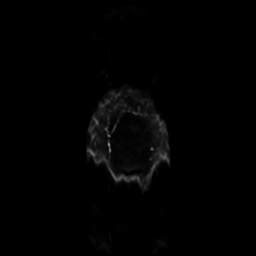

[Series 3: DWI · coronal · 4.0mm · 0.94mm/px · 7 of 72 slices shown (2 of 2)]
[im 1/72]
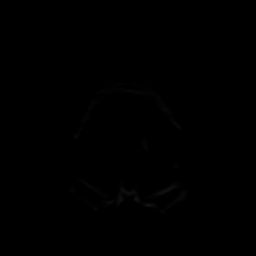
[im 12/72]
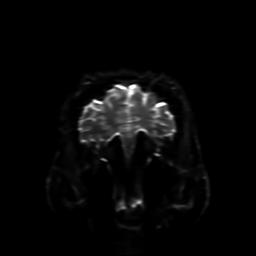
[im 24/72]
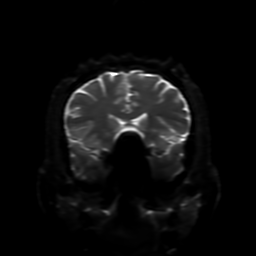
[im 36/72]
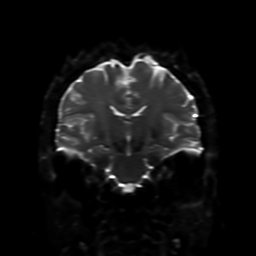
[im 48/72]
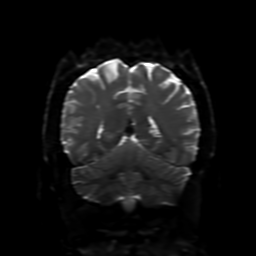
[im 60/72]
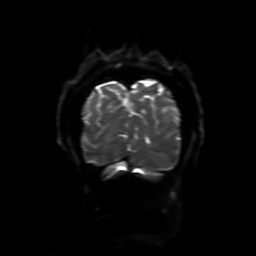
[im 72/72]
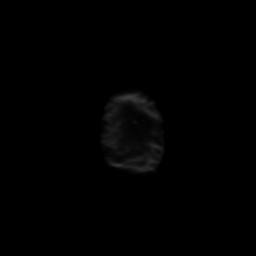

[Series 4: FLAIR · sagittal · 5.0mm · 0.23mm/px · 2 of 25 slices shown (1 of 2)]
[im 1/25]
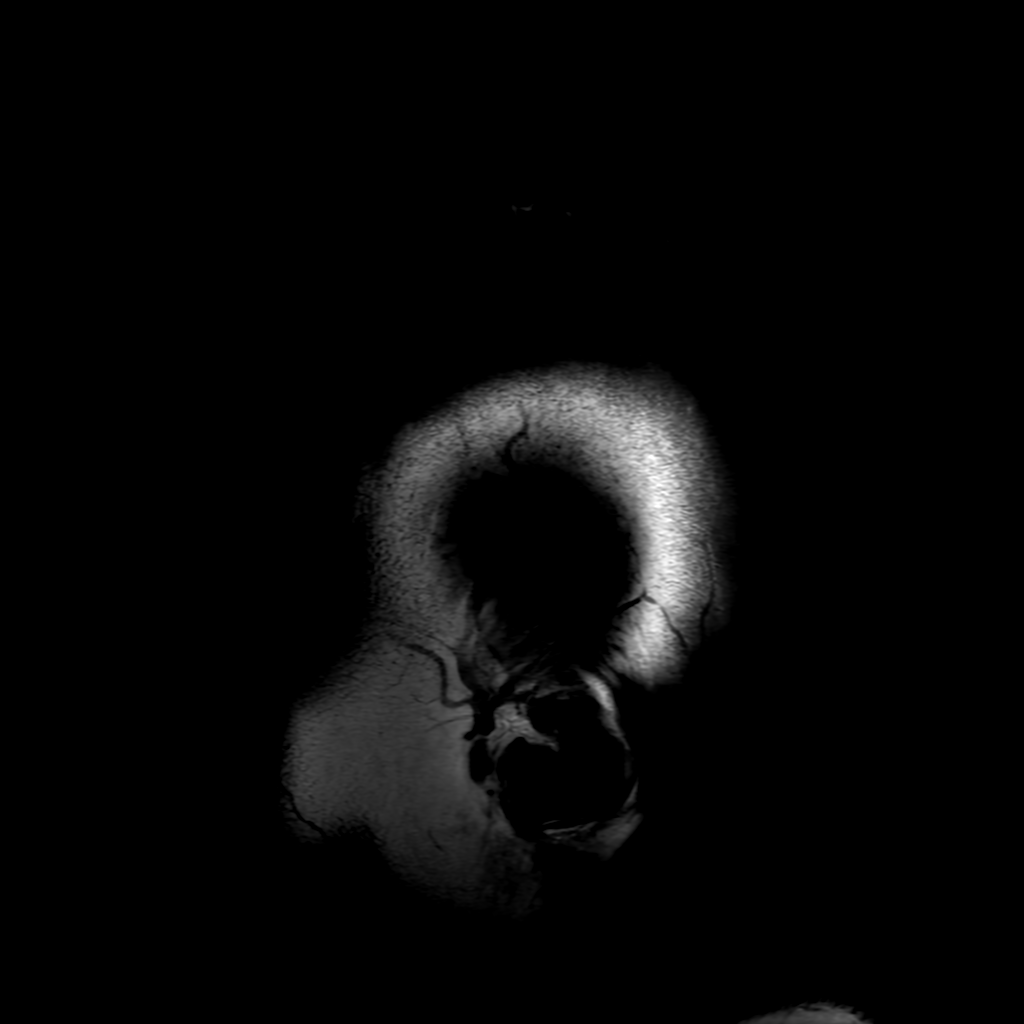
[im 25/25]
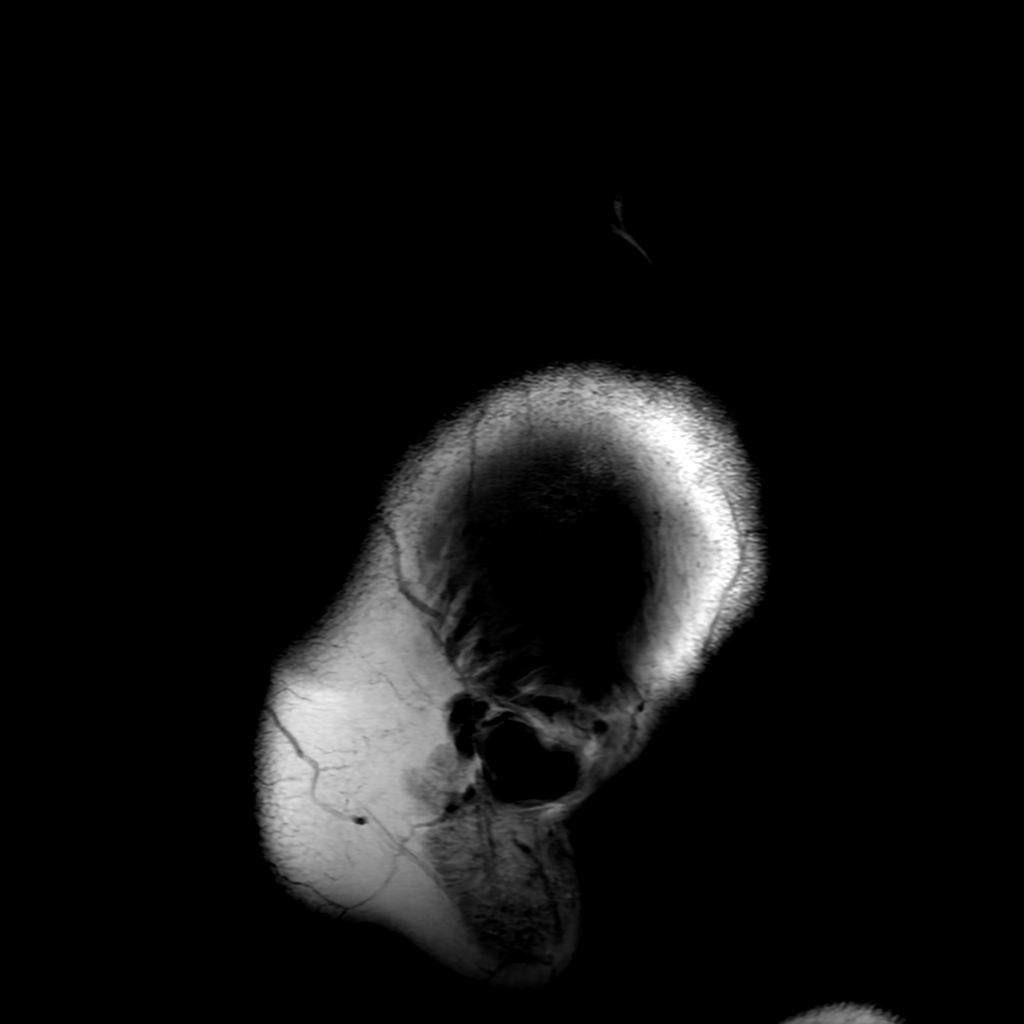

[Series 6: FLAIR · axial · 4.0mm · 0.45mm/px · z∈[-83,+66]mm · 3 of 35 slices shown (2 of 2)]
[im 1/35]
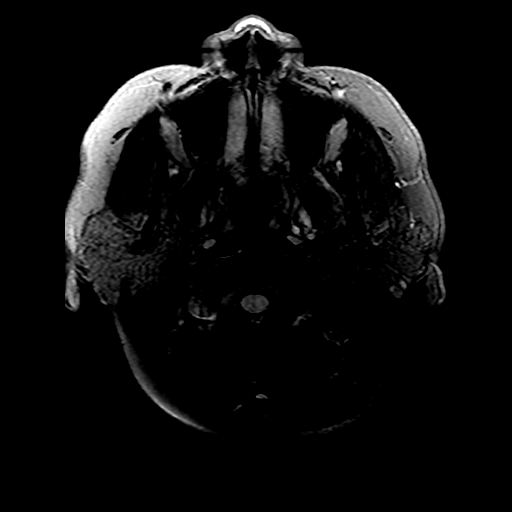
[im 18/35]
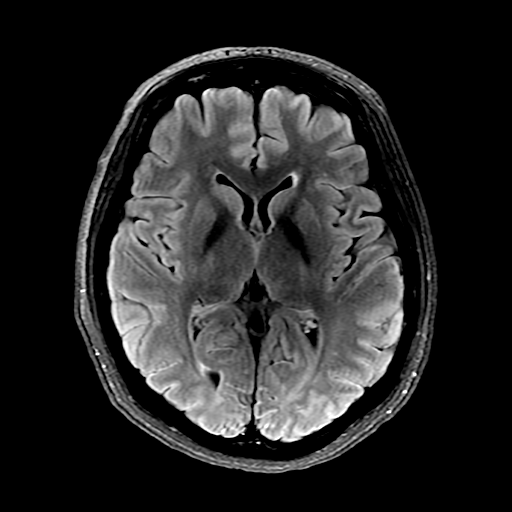
[im 35/35]
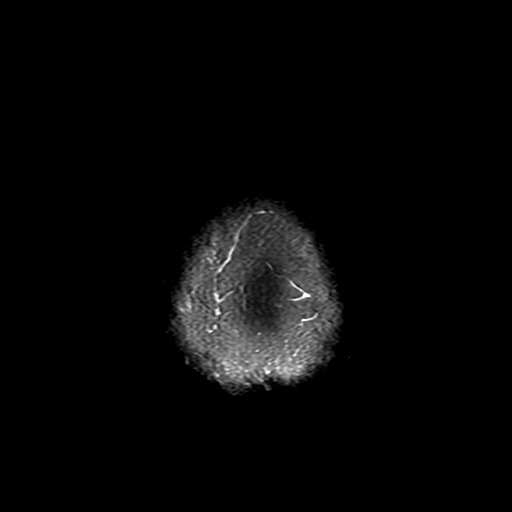

[Series 250: ADC · axial · 3.0mm · 0.94mm/px · z∈[-88,+59]mm · 5 of 50 slices shown (1 of 2)]
[im 1/50]
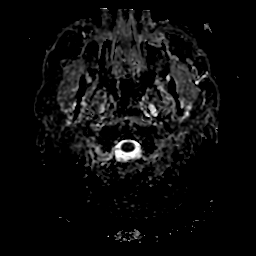
[im 13/50]
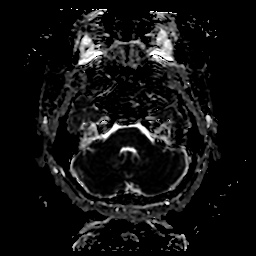
[im 25/50]
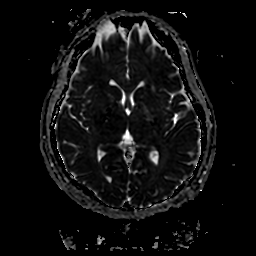
[im 37/50]
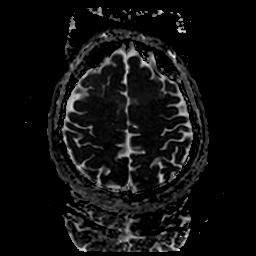
[im 50/50]
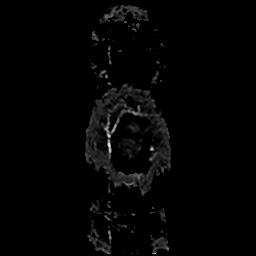

[Series 350: ADC · coronal · 4.0mm · 0.94mm/px · 3 of 35 slices shown (2 of 2)]
[im 1/35]
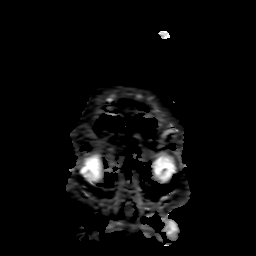
[im 18/35]
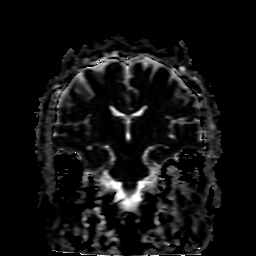
[im 35/35]
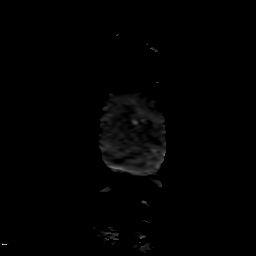

[29 of 48 positions shown; findings below may reference images not displayed]

FINDINGS: Brain: There is no evidence of an acute infarct, intracranial
hemorrhage, mass, midline shift, or extra-axial fluid collection.
The ventricles and sulci are normal. The cerebellar tonsils are
normally positioned. Scattered small T2 hyperintensities in the
subcortical and deep cerebral white matter bilaterally are mildly
advanced for age.

Vascular: Major intracranial vascular flow voids are preserved.

Skull and upper cervical spine: Unremarkable bone marrow signal.

Sinuses/Orbits: Unremarkable orbits. Clear paranasal sinuses. No
significant mastoid fluid.

Other: None.
IMPRESSION: 1. No acute intracranial abnormality.
2. Mild cerebral white matter T2 signal changes, nonspecific though
may reflect chronic small vessel ischemia, migraines, or prior
infection/inflammation.

## 2021-07-01 MED ORDER — ASPIRIN 81 MG PO CHEW: 81.0000 mg | CHEWABLE_TABLET | Freq: Every day | ORAL | 0 refills | Status: AC

## 2021-07-01 NOTE — Consult Note (Signed)
Neurology Consult H&P  Natalie Vasquez MR# [id number] 07/01/2021   CC: left face numbness and stuttering speech  History is obtained from: patient, EMS and chart.  HPI: Natalie Vasquez is a 51 y.o. female PMHx as reviewed below had face numbness and stuttering speech yesterday which later resolved however, recurred at 0930 this morning.  LKW: 0930 tNK given: No too mild to treat IR Thrombectomy Not indicated Modified Rankin Scale: 0-Completely asymptomatic and back to baseline post- stroke NIHSS: 1 face numbness  ROS: A complete ROS was performed and is negative except as noted in the HPI.   Past Medical History:  Diagnosis Date   Anxiety    Arthritis    Depression    Fibrosis, breast, left    GERD (gastroesophageal reflux disease)    Graves disease    Hypothyroid    Obstructive sleep apnea    uses CPAP at night   Pancreatitis    PTSD (post-traumatic stress disorder)    Urinary incontinence    No family history on file.  Social History:  reports that she has never smoked. She has never used smokeless tobacco. She reports that she does not drink alcohol and does not use drugs.   Prior to Admission medications   Medication Sig Start Date End Date Taking? Authorizing Provider  HYDROcodone-acetaminophen (NORCO/VICODIN) 5-325 MG tablet Take 1 tablet by mouth every 6 (six) hours as needed for moderate pain. 03/11/21   Vanetta Mulders, MD  levothyroxine (SYNTHROID) 75 MCG tablet Take 75 mcg by mouth daily before breakfast.    [provider]  meloxicam (MOBIC) 15 MG tablet Take 15 mg by mouth 2 (two) times daily.    [provider]  methocarbamol (ROBAXIN) 500 MG tablet Take 1 tablet (500 mg total) by mouth every 8 (eight) hours as needed for muscle spasms. Patient not taking: Reported on 03/11/2021 10/19/19   Ward, Layla Maw, DO  oxybutynin (DITROPAN-XL) 10 MG 24 hr tablet Take 12.5 mg by mouth 2 (two) times daily. Takes 12.5 mg 2x/day    [provider]  oxyCODONE-acetaminophen (PERCOCET/ROXICET) 5-325 MG tablet Take 2 tablets by mouth every 6 (six) hours as needed. Patient not taking: Reported on 03/11/2021 10/19/19   Ward, Layla Maw, DO  pantoprazole (PROTONIX) 40 MG tablet Take 40 mg by mouth daily.    [provider]  QUEtiapine (SEROQUEL) 100 MG tablet Take 5 mg by mouth at bedtime. Takes 5 mg at bedtime for PTSD.    [provider]  traZODone (DESYREL) 100 MG tablet Take 100 mg by mouth at bedtime.    [provider]    Exam: Current vital signs: Wt 121.8 kg    BMI 46.09 kg/m   Physical Exam  Constitutional: Appears well-developed and well-nourished.  Psych: Affect appropriate to situation Eyes: No scleral injection HENT: No OP obstruction. Head: Normocephalic.  Cardiovascular: Normal rate and regular rhythm.  Respiratory: Effort normal, symmetric excursions bilaterally, no audible wheezing. GI: Soft.  No distension. There is no tenderness.  Skin: WDI  Neuro: Mental Status: Patient is awake, alert, oriented to person, place, month, year, and situation. Patient is able to give a clear and coherent history. Speech fluent, intact comprehension and repetition. No signs of aphasia or neglect. Visual Fields are full. Pupils are equal, round, and reactive to light. EOMI without ptosis or diploplia.  Facial sensation is symmetric to temperature Facial movement is symmetric.  Hearing is intact to voice. Uvula midline and palate elevates symmetrically. Shoulder  shrug is symmetric. Tongue is midline without atrophy or fasciculations.  Tone is normal. Bulk is normal. 5/5 strength was present in all four extremities. Sensation is symmetric to light touch and temperature in the arms and legs. Deep Tendon Reflexes: 2+ and symmetric in the biceps and patellae. Toes are downgoing bilaterally. FNF and HKS are intact bilaterally. Gait - Deferred  I have reviewed labs in epic and the pertinent  results are: None available at time of evaluation  I have reviewed the images obtained: NCT head showed no acute abnormality  Assessment: Natalie Vasquez is a 51 y.o. 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 Speech on initial presentation was inconsistently stuttering however there was no aphasia or dysarthria.    On re-evaluation after CT her symptoms completely resolved and NIHSS 0.  Please cancel code stoke.   Impression:  Possible TIA cannot exclude anxiety attack. Anxiety Depression PTSD  Plan: - MRI brain without contrast - Ordered - If there is evidence of prior stroke she may complete echocardiogram as outpatient if it can be done in 24-48 hours. - Labs: lipid panel, A1c - Ordered. - SBP goal <140/90. - Consider starting aspirin 81mg .  This patient is critically ill and at significant risk of neurological worsening, death and care requires constant monitoring of vital signs, hemodynamics,respiratory and cardiac monitoring, neurological assessment, discussion with family, other specialists and medical decision making of high complexity. I spent 73 minutes of neurocritical care time  in the care of  this patient. This was time spent independent of any time provided by nurse practitioner or PA.  Electronically signed by:  , MD Page: Marisue Humble 07/01/2021, 10:56 AM  If 7pm- 7am, please page neurology on call as listed in [facility].

## 2021-07-01 NOTE — ED Notes (Signed)
Patient transported to MRI 

## 2021-07-01 NOTE — ED Notes (Signed)
IV team at bedside 

## 2021-07-01 NOTE — Code Documentation (Signed)
Stroke Response Nurse Documentation Code Documentation  Natalie Vasquez is a 51 y.o. female arriving to [facility] ED via [facility] on 07/01/2021 with past medical hx of Anxiety, GERD, . On No antithrombotic. Code stroke was activated by EMS.   Patient from home where she was LKW at [phone] and now complaining of facial numbness and difficulty speaking. Patient reports she had a similar event yesterday but it quickly resolved. Was working from home when she went to make a phone call and noticed it was difficult to get her words out, additionally, she had some numbness and tingling in her face.  Stroke team at the bedside on patient arrival. Labs drawn and patient cleared for CT by EDP. Patient to CT with team. NIHSS 2, see documentation for details and code stroke times. Patient with left decreased sensation on exam. The following imaging was completed:  CT. Patient is not a candidate for IV Thrombolytic due to no suspected acute event. Patient is not a candidate for IR due negative LVO.  Care/Plan: code stroke cancelled, patient returned to ED room.  Bedside handoff with ED RN.  Meda Klinefelter  Stroke Response RN

## 2021-07-01 NOTE — ED Provider Notes (Signed)
[facility] EMERGENCY DEPARTMENT Provider Note   CSN: [id number] Arrival date & time: 07/01/21  1033  An emergency department physician performed an initial assessment on this suspected stroke patient at 1033.  History  Chief Complaint  Patient presents with   Code Stroke    Natalie Vasquez is a 51 y.o. female.  51 year old woman presents with resolved left-sided face and left arm sensation changes with word-finding difficulty and stuttering. Patient was in her usual state of health until yesterday around 9 AM when she began having drowsiness, nausea, stuttering and overall feeling very poorly. She checked her blood glucose (126- non-fasting; no h/o DM) and blood pressure (120s/70s reported), laid down and felt somewhat better after a few hours. She went back to work in the afternoon and went to bed. This morning around 9 AM again, patient started having stuttering, word finding difficulty, and had diminished sensation and heaviness of the left face and left upper extremity. Patient reports that she is a patient at the [place] and recently started lurasidone with an increase to 40 mg qd 4 days ago for "anxiety and depression" as well as a history of visual hallucinations. Patient reports that she sometimes sees strange men in her home, skeletons walking down stairs, or sometimes her mother's gown when her mother is not at home. She denies any VAH, SI, HI today. PMH includes Grave's disease, anxiety/depression, class III obesity, GERD.     Home Medications Prior to Admission medications   Medication Sig Start Date End Date Taking? Authorizing Provider  HYDROcodone-acetaminophen (NORCO/VICODIN) 5-325 MG tablet Take 1 tablet by mouth every 6 (six) hours as needed for moderate pain. 03/11/21   Vanetta Mulders, MD  levothyroxine (SYNTHROID) 75 MCG tablet Take 75 mcg by mouth daily before breakfast.    [provider]  meloxicam (MOBIC) 15 MG tablet Take 15 mg by mouth 2  (two) times daily.    [provider]  methocarbamol (ROBAXIN) 500 MG tablet Take 1 tablet (500 mg total) by mouth every 8 (eight) hours as needed for muscle spasms. Patient not taking: Reported on 03/11/2021 10/19/19   Ward, Layla Maw, DO  oxybutynin (DITROPAN-XL) 10 MG 24 hr tablet Take 12.5 mg by mouth 2 (two) times daily. Takes 12.5 mg 2x/day    [provider]  oxyCODONE-acetaminophen (PERCOCET/ROXICET) 5-325 MG tablet Take 2 tablets by mouth every 6 (six) hours as needed. Patient not taking: Reported on 03/11/2021 10/19/19   Ward, Layla Maw, DO  pantoprazole (PROTONIX) 40 MG tablet Take 40 mg by mouth daily.    [provider]  QUEtiapine (SEROQUEL) 100 MG tablet Take 5 mg by mouth at bedtime. Takes 5 mg at bedtime for PTSD.    [provider]  traZODone (DESYREL) 100 MG tablet Take 100 mg by mouth at bedtime.    [provider]      Allergies    Metoclopramide    Review of Systems   Review of Systems  Constitutional:  Positive for fatigue. Negative for activity change, appetite change, chills and fever.  HENT:  Negative for drooling and rhinorrhea.   Respiratory:  Negative for cough, shortness of breath and wheezing.   Cardiovascular:  Negative for chest pain, palpitations and leg swelling.  Gastrointestinal:  Negative for abdominal pain, constipation, diarrhea, nausea and vomiting.  Genitourinary:  Negative for dysuria and flank pain.  Musculoskeletal:  Negative for arthralgias and myalgias.  Neurological:  Positive for speech difficulty. Negative for dizziness, syncope, facial asymmetry,  weakness, numbness and headaches.  Psychiatric/Behavioral:  Negative for confusion, decreased concentration and hallucinations. The patient is not nervous/anxious.   All other systems reviewed and are negative.  Physical Exam Updated Vital Signs BP 129/61 (BP Location: Left Arm)    Pulse 67    Temp 97.9 F (36.6 C) (Oral)    Resp 18    Ht 5\' 6"  (1.676  m)    Wt 121.8 kg    SpO2 98%    BMI 43.34 kg/m  Physical Exam Vitals and nursing note reviewed.  Constitutional:      General: She is not in acute distress.    Appearance: Normal appearance. She is obese. She is not ill-appearing, toxic-appearing or diaphoretic.  HENT:     Head: Normocephalic and atraumatic.     Mouth/Throat:     Mouth: Mucous membranes are moist.     Pharynx: Oropharynx is clear. No oropharyngeal exudate or posterior oropharyngeal erythema.  Eyes:     General: No scleral icterus.    Extraocular Movements: Extraocular movements intact.     Conjunctiva/sclera: Conjunctivae normal.     Pupils: Pupils are equal, round, and reactive to light.  Cardiovascular:     Rate and Rhythm: Regular rhythm. Bradycardia present.     Pulses: Normal pulses.     Heart sounds: Normal heart sounds. No murmur heard.   No friction rub. No gallop.  Pulmonary:     Effort: Pulmonary effort is normal. No respiratory distress.     Breath sounds: Normal breath sounds. No stridor. No wheezing, rhonchi or rales.  Abdominal:     General: Abdomen is flat. Bowel sounds are normal. There is no distension.     Palpations: Abdomen is soft.     Tenderness: There is no abdominal tenderness. There is no guarding.  Musculoskeletal:        General: No swelling or tenderness.     Right lower leg: No edema.     Left lower leg: No edema.  Skin:    General: Skin is warm and dry.  Neurological:     Mental Status: She is alert.     Comments: Cranial Nerves II: Visual Fields are full. PERRL.  III,IV, VI: EOMI without ptosis or diplopia.  V: Facial sensation is symmetric to touch VII: Facial movement is symmetric.  VIII: Hearing is intact to voice X: Palate elevates symmetrically XI: Shoulder shrug is symmetric. XII: Tongue is midline without atrophy or fasciculations.  Motor: Tone is normal. Bulk is normal. 5/5 strength was present in all four extremities.  Sensory: Sensation is symmetric to light  touch in the arms and legs. Deep Tendon Reflexes: 2+ and symmetric in the biceps and patellae.  Plantars: Toes are downgoing bilaterally.    Psychiatric:        Mood and Affect: Mood normal.        Behavior: Behavior normal.        Thought Content: Thought content normal.    ED Results / Procedures / Treatments   Labs (all labs ordered are listed, but only abnormal results are displayed) Labs Reviewed  CBC - Abnormal; Notable for the following components:      Result Value   Hemoglobin 11.8 (*)    All other components within normal limits  DIFFERENTIAL  RAPID URINE DRUG SCREEN, HOSP PERFORMED  URINALYSIS, ROUTINE W REFLEX MICROSCOPIC  LIPID PANEL  HEMOGLOBIN A1C  ETHANOL  PROTIME-INR  APTT  COMPREHENSIVE METABOLIC PANEL  TSH  CBG MONITORING, ED  I-STAT  BETA HCG BLOOD, ED ([facility], [facility], [facility] ONLY)    EKG EKG Interpretation  Date/Time:  Thursday July 01 2021 11:03:03 EST Ventricular Rate:  60 PR Interval:  142 QRS Duration: 96 QT Interval:  423 QTC Calculation: 423 R Axis:   20 Text Interpretation: Sinus rhythm Abnormal R-wave progression, early transition Left ventricular hypertrophy No significant change since last tracing Confirmed by Melene Plan [phone]) on 07/01/2021 11:06:24 AM  Radiology CT HEAD CODE STROKE WO CONTRAST  Result Date: 07/01/2021 CLINICAL DATA:  Code stroke.  Neuro deficit, acute, stroke suspected EXAM: CT HEAD WITHOUT CONTRAST TECHNIQUE: Contiguous axial images were obtained from the base of the skull through the vertex without intravenous contrast. RADIATION DOSE REDUCTION: This exam was performed according to the departmental dose-optimization program which includes automated exposure control, adjustment of the mA and/or kV according to patient size and/or use of iterative reconstruction technique. COMPARISON:  None. FINDINGS: Brain: No evidence of acute large vascular territory infarction, hemorrhage, hydrocephalus, extra-axial collection or mass  lesion/mass effect. Vascular: No hyperdense vessel identified. Skull: No acute fracture. Sinuses/Orbits: Clear sinuses.  Unremarkable orbits. Other: No mastoid effusion. ASPECTS [facility] Stroke Program Early CT Score) total score (0-10 with 10 being normal): 10. IMPRESSION: No evidence of acute large vascular territory infarct or acute hemorrhage. ASPECTS is 10. Code stroke imaging results were communicated on 07/01/2021 at 11:01 am to provider [facility] via telephone, who verbally acknowledged these results. Electronically Signed   By: Feliberto Harts M.D.   On: 07/01/2021 11:02    Procedures Procedures    Medications Ordered in ED Medications - No data to display  ED Course/ Medical Decision Making/ A&P Clinical Course as of 07/01/21 1506  Thu Jul 01, 2021  1413 Awaiting MRI. So far CBC notable for mild anemia. Hand off to Dr. Silverio Lay. Once MRI completed will determine disposition. Also considering adverse reaction to medication given recent increase of lurasidone. [CM]    Clinical Course User Index [CM] Shirlean Mylar, MD                           Medical Decision Making 51 yo woman presents with resolved symptoms of diminished sensation in left face, left arm, stuttering and word-finding difficulty. CT head WNL, awaiting MRI results, TIA vs stroke is still on differential. Also on differential is medication reaction as patient recently increased lurasidone for [facility] (suspect possible schizoaffective diagnosis). Labs for CBC, CMP, A1c, lipid panel, TSH pending. Discussed with neurology team, appreciate their recommendations. If MRI is negative, patient can discharge and follow up with PCP.   3:06- Awaiting MRI. So far CBC notable for mild anemia. Hand off to Dr. Silverio Lay. Once MRI completed will determine disposition. Also considering adverse reaction to medication given recent increase of lurasidone.  Amount and/or Complexity of Data Reviewed External Data Reviewed: notes. Labs:  ordered. Radiology: ordered. ECG/medicine tests: ordered.  Risk Decision regarding hospitalization.         Final Clinical Impression(s) / ED Diagnoses Final diagnoses:  None    Rx / DC Orders ED Discharge Orders     None         Shirlean Mylar, MD 07/01/21 1506    Melene Plan, DO 07/01/21 1513

## 2021-07-01 NOTE — ED Triage Notes (Addendum)
LNW [id number], difficulty with words, left facial numbness, blurred vision left visual field, stuttering and with word slippage-per medic

## 2021-07-01 NOTE — ED Notes (Signed)
Returned from MRI 

## 2021-07-01 NOTE — ED Provider Notes (Signed)
°  Physical Exam  BP 138/86    Pulse (!) 59    Temp 97.9 F (36.6 C) (Oral)    Resp 20    Ht 5\' 6"  (1.676 m)    Wt 121.8 kg    SpO2 100%    BMI 43.34 kg/m   Physical Exam  Procedures  Procedures  ED Course / MDM   Clinical Course as of 07/01/21 1710  Thu Jul 01, 2021  1413 Awaiting MRI. So far CBC notable for mild anemia. Hand off to Dr. Darl Householder. Once MRI completed will determine disposition. Also considering adverse reaction to medication given recent increase of lurasidone. [CM]    Clinical Course User Index [CM] Gladys Damme, MD   Medical Decision Making Care assumed at 3 pm. Patient here with slurred speech. Code stroke activated. Back to baseline on arrival. Sign out pending MRI brain.   5:11 PM MRI brain showed no stroke. Dr. Theda Sers recommend ASA 81 mg daily, neuro follow up.     Amount and/or Complexity of Data Reviewed Labs: ordered. Radiology: ordered and independent interpretation performed. Decision-making details documented in ED Course. ECG/medicine tests: ordered and independent interpretation performed. Decision-making details documented in ED Course.          Drenda Freeze, MD 07/01/21 [phone]

## 2021-07-01 NOTE — Discharge Instructions (Addendum)
Take ASA 81 mg daily   See a neurologist for follow up   Return to ER if you have worse slurred speech, weakness, numbness.

## 2021-07-28 ENCOUNTER — Telehealth: Payer: Self-pay | Admitting: Neurology

## 2021-07-28 ENCOUNTER — Encounter: Payer: Self-pay | Admitting: Neurology

## 2021-07-28 ENCOUNTER — Ambulatory Visit (INDEPENDENT_AMBULATORY_CARE_PROVIDER_SITE_OTHER): Payer: No Typology Code available for payment source | Admitting: Neurology

## 2021-07-28 VITALS — BP 135/88 | HR 69 | Ht 66.0 in | Wt 261.0 lb

## 2021-07-28 DIAGNOSIS — G43909 Migraine, unspecified, not intractable, without status migrainosus: Secondary | ICD-10-CM | POA: Insufficient documentation

## 2021-07-28 DIAGNOSIS — N920 Excessive and frequent menstruation with regular cycle: Secondary | ICD-10-CM | POA: Insufficient documentation

## 2021-07-28 DIAGNOSIS — M1990 Unspecified osteoarthritis, unspecified site: Secondary | ICD-10-CM | POA: Insufficient documentation

## 2021-07-28 DIAGNOSIS — Y991 Military activity: Secondary | ICD-10-CM | POA: Insufficient documentation

## 2021-07-28 DIAGNOSIS — G459 Transient cerebral ischemic attack, unspecified: Secondary | ICD-10-CM | POA: Diagnosis not present

## 2021-07-28 DIAGNOSIS — Z8669 Personal history of other diseases of the nervous system and sense organs: Secondary | ICD-10-CM | POA: Insufficient documentation

## 2021-07-28 DIAGNOSIS — K589 Irritable bowel syndrome without diarrhea: Secondary | ICD-10-CM | POA: Insufficient documentation

## 2021-07-28 DIAGNOSIS — M228X9 Other disorders of patella, unspecified knee: Secondary | ICD-10-CM | POA: Insufficient documentation

## 2021-07-28 DIAGNOSIS — E039 Hypothyroidism, unspecified: Secondary | ICD-10-CM | POA: Insufficient documentation

## 2021-07-28 DIAGNOSIS — F339 Major depressive disorder, recurrent, unspecified: Secondary | ICD-10-CM | POA: Insufficient documentation

## 2021-07-28 DIAGNOSIS — G43009 Migraine without aura, not intractable, without status migrainosus: Secondary | ICD-10-CM | POA: Diagnosis not present

## 2021-07-28 DIAGNOSIS — T7421XA Adult sexual abuse, confirmed, initial encounter: Secondary | ICD-10-CM | POA: Insufficient documentation

## 2021-07-28 DIAGNOSIS — F4001 Agoraphobia with panic disorder: Secondary | ICD-10-CM | POA: Insufficient documentation

## 2021-07-28 DIAGNOSIS — R7611 Nonspecific reaction to tuberculin skin test without active tuberculosis: Secondary | ICD-10-CM | POA: Insufficient documentation

## 2021-07-28 DIAGNOSIS — F411 Generalized anxiety disorder: Secondary | ICD-10-CM | POA: Insufficient documentation

## 2021-07-28 DIAGNOSIS — G4733 Obstructive sleep apnea (adult) (pediatric): Secondary | ICD-10-CM | POA: Insufficient documentation

## 2021-07-28 DIAGNOSIS — M545 Low back pain, unspecified: Secondary | ICD-10-CM | POA: Insufficient documentation

## 2021-07-28 DIAGNOSIS — N946 Dysmenorrhea, unspecified: Secondary | ICD-10-CM | POA: Insufficient documentation

## 2021-07-28 DIAGNOSIS — K645 Perianal venous thrombosis: Secondary | ICD-10-CM | POA: Insufficient documentation

## 2021-07-28 DIAGNOSIS — K760 Fatty (change of) liver, not elsewhere classified: Secondary | ICD-10-CM | POA: Insufficient documentation

## 2021-07-28 DIAGNOSIS — F419 Anxiety disorder, unspecified: Secondary | ICD-10-CM | POA: Insufficient documentation

## 2021-07-28 DIAGNOSIS — N3281 Overactive bladder: Secondary | ICD-10-CM | POA: Insufficient documentation

## 2021-07-28 DIAGNOSIS — J309 Allergic rhinitis, unspecified: Secondary | ICD-10-CM | POA: Insufficient documentation

## 2021-07-28 DIAGNOSIS — R109 Unspecified abdominal pain: Secondary | ICD-10-CM | POA: Insufficient documentation

## 2021-07-28 DIAGNOSIS — K861 Other chronic pancreatitis: Secondary | ICD-10-CM | POA: Insufficient documentation

## 2021-07-28 DIAGNOSIS — K219 Gastro-esophageal reflux disease without esophagitis: Secondary | ICD-10-CM | POA: Insufficient documentation

## 2021-07-28 DIAGNOSIS — M722 Plantar fascial fibromatosis: Secondary | ICD-10-CM | POA: Insufficient documentation

## 2021-07-28 DIAGNOSIS — R7303 Prediabetes: Secondary | ICD-10-CM | POA: Insufficient documentation

## 2021-07-28 DIAGNOSIS — Z719 Counseling, unspecified: Secondary | ICD-10-CM | POA: Insufficient documentation

## 2021-07-28 DIAGNOSIS — M898X9 Other specified disorders of bone, unspecified site: Secondary | ICD-10-CM | POA: Insufficient documentation

## 2021-07-28 MED ORDER — SUMATRIPTAN SUCCINATE 50 MG PO TABS: 50.0000 mg | ORAL_TABLET | ORAL | 6 refills | Status: DC | PRN

## 2021-07-28 MED ORDER — NURTEC 75 MG PO TBDP: 75.0000 mg | ORAL_TABLET | ORAL | 11 refills | Status: AC

## 2021-07-28 MED ORDER — AMITRIPTYLINE HCL 25 MG PO TABS: 25.0000 mg | ORAL_TABLET | Freq: Every day | ORAL | 11 refills | Status: DC

## 2021-07-28 NOTE — Telephone Encounter (Signed)
[facility] order sent to GI, the patient was referred from ER. GI will reach out to the patient to schedule.  ?

## 2021-07-28 NOTE — Progress Notes (Signed)
[facility]  PATIENT: Natalie Vasquez DOB: 04-07-1971  REQUESTING CLINICIAN: Sherwood Gambler, MD HISTORY FROM: Patient and mother  REASON FOR VISIT: Words finding difficulty/TIA    HISTORICAL  CHIEF COMPLAINT:  Chief Complaint  Patient presents with   New Patient (Initial Visit)    Rm 13. Accompanied by mother. NP/ED referral for TIA/sched with pt over phone. Pt reported allergy to new medication after ministroke- unsure of med name. Pt reports issues with memory, concentration, and speech processing.    HISTORY OF PRESENT ILLNESS:  This is a 51 year old woman with past medical history of depression, anxiety, and hypothyroidism who is presenting with word finding difficulty, brain fog.  Patient presented to the ED on February 2 for an episode of word finding difficulty, left arm and left face numbness.  She reported day before she was very tired, she could not see she has to force herself to speak, she checked her blood sugar was normal and her blood pressure was also normal.  The next morning she felt really tired and she was having difficulty getting the words out to the point that she could not speak anymore and she started making loud noises which get the attention of her mother who called EMS. Patient report during that time the only change was that he her lurasidone was increased 4 days prior for anxiety and depression.  She reported history of visual and auditory hallucinations.  In the ED her MRI brain was negative for acute stroke, he was discharged home with aspirin, felt that her symptoms might be secondary to the medication change.  Since then she has stopped the lurasidone and no additional symptoms.  She does not have a diagnosis of schizophrenia. She also has a history of migraine headaches and has been on Topamax 100 mg nightly.   OTHER MEDICAL CONDITIONS: Depression, Anxiety, Hypothyroidism (manage at the [place])   REVIEW OF SYSTEMS: Full 14  system review of systems performed and negative with exception of: as noted in the HPI   ALLERGIES: Allergies  Allergen Reactions   Buspirone    Flunisolide    Isoniazid    Menthol    Metoclopramide Other (See Comments)    Dystonia reaction   Quetiapine    Topiramate     HOME MEDICATIONS: Outpatient Medications Prior to Visit  Medication Sig Dispense Refill   aspirin 81 MG chewable tablet Chew 1 tablet (81 mg total) by mouth daily. 30 tablet 0   DULoxetine (CYMBALTA) 60 MG capsule Take 120 mg by mouth at bedtime.     levothyroxine (SYNTHROID) 100 MCG tablet Take 1 tablet by mouth daily.     levothyroxine (SYNTHROID) 75 MCG tablet Take 75 mcg by mouth daily before breakfast.     lidocaine (LIDODERM) 5 % 1 patch daily as needed (pain).     meloxicam (MOBIC) 15 MG tablet Take 15 mg by mouth 2 (two) times daily.     oxybutynin (DITROPAN-XL) 10 MG 24 hr tablet Take 5 mg by mouth 2 (two) times daily.     pantoprazole (PROTONIX) 40 MG tablet Take 40 mg by mouth at bedtime.     REFRESH 1.4-0.6 % SOLN Apply 1 drop to eye daily as needed (dry eyes).     traMADol (ULTRAM) 50 MG tablet Take 50 mg by mouth every 6 (six) hours as needed for moderate pain.     traZODone (DESYREL) 100 MG tablet Take 50-100 mg by mouth at bedtime as needed for sleep.  topiramate (TOPAMAX) 100 MG tablet Take 100 mg by mouth at bedtime.     HYDROcodone-acetaminophen (NORCO/VICODIN) 5-325 MG tablet Take 1 tablet by mouth every 6 (six) hours as needed for moderate pain. (Patient not taking: Reported on 07/01/2021) 14 tablet 0   methocarbamol (ROBAXIN) 500 MG tablet Take 1 tablet (500 mg total) by mouth every 8 (eight) hours as needed for muscle spasms. (Patient not taking: Reported on 03/11/2021) 15 tablet 0   oxyCODONE-acetaminophen (PERCOCET/ROXICET) 5-325 MG tablet Take 2 tablets by mouth every 6 (six) hours as needed. (Patient not taking: Reported on 03/11/2021) 12 tablet 0   phentermine (ADIPEX-P) 37.5 MG tablet  Take 18.75 mg by mouth 2 (two) times a week.     No facility-administered medications prior to visit.    PAST MEDICAL HISTORY: Past Medical History:  Diagnosis Date   Anxiety    Arthritis    Depression    Fibrosis, breast, left    GERD (gastroesophageal reflux disease)    Graves disease    Hypothyroid    Obstructive sleep apnea    uses CPAP at night   Pancreatitis    PTSD (post-traumatic stress disorder)    Urinary incontinence     PAST SURGICAL HISTORY: Past Surgical History:  Procedure Laterality Date   BREAST REDUCTION SURGERY Bilateral 1997   CHOLECYSTECTOMY  1998   KNEE ARTHROSCOPY Left 2017   LIPOSUCTION Bilateral 1997   hips and thighs    FAMILY HISTORY: History reviewed. No pertinent family history.  SOCIAL HISTORY: Social History   Socioeconomic History   Marital status: Single    Spouse name: Not on file   Number of children: Not on file   Years of education: Not on file   Highest education level: Not on file  Occupational History   Not on file  Tobacco Use   Smoking status: Never   Smokeless tobacco: Never  Vaping Use   Vaping Use: Never used  Substance and Sexual Activity   Alcohol use: Never   Drug use: Never   Sexual activity: Not Currently  Other Topics Concern   Not on file  Social History Narrative   Not on file   Social Determinants of Health   Financial Resource Strain: Not on file  Food Insecurity: Not on file  Transportation Needs: Not on file  Physical Activity: Not on file  Stress: Not on file  Social Connections: Not on file  Intimate Partner Violence: Not on file    PHYSICAL EXAM  GENERAL EXAM/CONSTITUTIONAL: Vitals:  Vitals:   07/28/21 1307  BP: 135/88  Pulse: 69  Weight: 261 lb (118.4 kg)  Height: 5\' 6"  (1.676 m)   Body mass index is 42.13 kg/m. Wt Readings from Last 3 Encounters:  07/28/21 261 lb (118.4 kg)  07/01/21 268 lb 8.3 oz (121.8 kg)  03/11/21 250 lb (113.4 kg)   Patient is in no distress;  well developed, nourished and groomed; neck is supple  CARDIOVASCULAR: Examination of carotid arteries is normal; no carotid bruits Regular rate and rhythm, no murmurs Examination of peripheral vascular system by observation and palpation is normal  EYES: Pupils round and reactive to light, Visual fields full to confrontation, Extraocular movements intacts,   MUSCULOSKELETAL: Gait, strength, tone, movements noted in Neurologic exam below  NEUROLOGIC: MENTAL STATUS:  No flowsheet data found. awake, alert, oriented to person, place and time recent and remote memory intact normal attention and concentration language fluent, comprehension intact, naming intact fund of knowledge appropriate  CRANIAL  NERVE:  2nd, 3rd, 4th, 6th - pupils equal and reactive to light, visual fields full to confrontation, extraocular muscles intact, no nystagmus 5th - facial sensation symmetric 7th - facial strength symmetric 8th - hearing intact 9th - palate elevates symmetrically, uvula midline 11th - shoulder shrug symmetric 12th - tongue protrusion midline  MOTOR:  normal bulk and tone, full strength in the BUE, BLE  SENSORY:  normal and symmetric to light touch, pinprick, temperature, vibration  COORDINATION:  finger-nose-finger, fine finger movements normal  REFLEXES:  deep tendon reflexes present and symmetric  GAIT/STATION:  normal     DIAGNOSTIC DATA (LABS, IMAGING, TESTING) - I reviewed patient records, labs, notes, testing and imaging myself where available.  Lab Results  Component Value Date   WBC 7.0 07/01/2021   HGB 11.8 (L) 07/01/2021   HCT 37.2 07/01/2021   MCV 93.2 07/01/2021   PLT 342 07/01/2021      Component Value Date/Time   NA 138 07/01/2021 1410   K 3.6 07/01/2021 1410   CL 104 07/01/2021 1410   CO2 27 07/01/2021 1410   GLUCOSE 90 07/01/2021 1410   BUN 14 07/01/2021 1410   CREATININE 0.86 07/01/2021 1410   CALCIUM 9.1 07/01/2021 1410   PROT 6.6  07/01/2021 1410   ALBUMIN 3.2 (L) 07/01/2021 1410   AST 19 07/01/2021 1410   ALT 16 07/01/2021 1410   ALKPHOS 63 07/01/2021 1410   BILITOT 0.2 (L) 07/01/2021 1410   GFRNONAA >60 07/01/2021 1410   GFRAA >60 12/15/2018 1414   No results found for: CHOL, HDL, LDLCALC, LDLDIRECT, TRIG, CHOLHDL No results found for: VFIE3P No results found for: VITAMINB12 Lab Results  Component Value Date   TSH 37.444 (H) 12/15/2018    MRI Brain 07/01/21 1. No acute intracranial abnormality. 2. Mild cerebral white matter T2 signal changes, nonspecific though may reflect chronic small vessel ischemia, migraines, or prior infection/inflammation.    ASSESSMENT AND PLAN  51 y.o. year old female with anxiety depression, migraine headaches who is presenting after a TIA. Patient was seen in the hospital last month for word finding difficulty and left-sided face and arm numbness.  Her MRI was negative for any acute stroke, she was started on aspirin 81 mg daily.  I will complete the stroke/TIA work-up with lipid and A1c also obtain a CT angiogram head and neck.  I will contact the patient to go over the result.  Advised her to continue with aspirin 81 mg daily. For her migraine headaches she is on Topamax 100 mg as prevention but she does also complain of brain fog and word finding difficulty.  I will switch her to Nurtec, advised patient to decrease topiramate to half tablet 50 mg nightly for 1 week then discontinue it while starting Nurtec 75 mg every other day.  If she does have a severe headaches, advised her to take sumatriptan 50 mg as soon as the headache starts, she can take another dose 2 hours later if headaches are not controlled.  Follow-up in 6 months or sooner if worse. Follow up with PCP for management of Anxiety/Depression   1. TIA (transient ischemic attack)   2. Migraine without aura and without status migrainosus, not intractable     Patient Instructions  We will complete stroke labs including  lipid panel and hemoglobin A1c CT angiogram head and neck Discontinue topiramate, first decrease it to half tablet nightly for 1 week then stop the medication.  He has side effect of word finding difficulty  and brain fog Start Nurtec every other day for migraine prevention Start sumatriptan as needed for severe headache, take 1 as soon as the headache starts, can take a second dose after 2 hours if headaches are not resolved Return in 6 months for follow-up.  Orders Placed This Encounter  Procedures   CT ANGIO HEAD CODE STROKE   CT ANGIO NECK W OR WO CONTRAST   Lipid panel   Hemoglobin A1c    Meds ordered this encounter  Medications   DISCONTD: amitriptyline (ELAVIL) 25 MG tablet    Sig: Take 1 tablet (25 mg total) by mouth at bedtime.    Dispense:  30 tablet    Refill:  11   SUMAtriptan (IMITREX) 50 MG tablet    Sig: Take 1 tablet (50 mg total) by mouth every 2 (two) hours as needed for migraine. May repeat in 2 hours if headache persists or recurs.    Dispense:  10 tablet    Refill:  6   Rimegepant Sulfate (NURTEC) 75 MG TBDP    Sig: Take 75 mg by mouth every other day.    Dispense:  16 tablet    Refill:  11    Return in about 6 months (around 01/28/2022).  I have spent a total of 65 minutes dedicated to this patient today, preparing to see patient, performing a medically appropriate examination and evaluation, ordering tests and/or medications and procedures, and counseling and educating the patient/family/caregiver; independently interpreting result and communicating results to the family/patient/caregiver; and documenting clinical information in the electronic medical record.   Windell Norfolk, MD 07/28/2021, 2:27 PM  [facility] [address] [place], [place] [address] [phone]

## 2021-07-28 NOTE — Patient Instructions (Addendum)
We will complete stroke labs including lipid panel and hemoglobin A1c ?CT angiogram head and neck ?Continue with aspirin 81 mg daily ?Discontinue topiramate, first decrease it to half tablet nightly for 1 week then stop the medication.  He has side effect of word finding difficulty and brain fog ?Start Nurtec every other day for migraine prevention ?Start sumatriptan as needed for severe headache, take 1 as soon as the headache starts, can take a second dose after 2 hours if headaches are not resolved ?Return in 6 months for follow-up. ?

## 2021-07-29 LAB — LIPID PANEL
Chol/HDL Ratio: 2.3 ratio (ref 0.0–4.4)
Cholesterol, Total: 170 mg/dL (ref 100–199)
HDL: 73 mg/dL (ref >39)
LDL Chol Calc (NIH): 78 mg/dL (ref 0–99)
Triglycerides: 108 mg/dL (ref 0–149)
VLDL Cholesterol Cal: 19 mg/dL (ref 5–40)

## 2021-07-29 LAB — HEMOGLOBIN A1C
Est. average glucose Bld gHb Est-mCnc: 128 mg/dL
Hgb A1c MFr Bld: 6.1 % — ABNORMAL HIGH (ref 4.8–5.6)

## 2021-08-09 ENCOUNTER — Encounter: Payer: Self-pay | Admitting: Neurology

## 2021-08-12 ENCOUNTER — Emergency Department (HOSPITAL_COMMUNITY): Payer: No Typology Code available for payment source

## 2021-08-12 ENCOUNTER — Emergency Department (HOSPITAL_COMMUNITY)
Admission: EM | Admit: 2021-08-12 | Discharge: 2021-08-12 | Disposition: A | Discharge status: Home / Self Care | Payer: No Typology Code available for payment source | Attending: Emergency Medicine | Admitting: Emergency Medicine

## 2021-08-12 ENCOUNTER — Encounter (HOSPITAL_COMMUNITY): Payer: Self-pay

## 2021-08-12 ENCOUNTER — Other Ambulatory Visit: Payer: Self-pay

## 2021-08-12 DIAGNOSIS — Z7982 Long term (current) use of aspirin: Secondary | ICD-10-CM | POA: Diagnosis not present

## 2021-08-12 DIAGNOSIS — R479 Unspecified speech disturbances: Secondary | ICD-10-CM | POA: Insufficient documentation

## 2021-08-12 DIAGNOSIS — R519 Headache, unspecified: Secondary | ICD-10-CM | POA: Diagnosis not present

## 2021-08-12 DIAGNOSIS — R29810 Facial weakness: Secondary | ICD-10-CM | POA: Diagnosis present

## 2021-08-12 LAB — COMPREHENSIVE METABOLIC PANEL
ALT: 26 U/L (ref 0–44)
AST: 26 U/L (ref 15–41)
Albumin: 4 g/dL (ref 3.5–5.0)
Alkaline Phosphatase: 82 U/L (ref 38–126)
Anion gap: 9 (ref 5–15)
BUN: 18 mg/dL (ref 6–20)
CO2: 22 mmol/L (ref 22–32)
Calcium: 9.5 mg/dL (ref 8.9–10.3)
Chloride: 108 mmol/L (ref 98–111)
Creatinine, Ser: 0.9 mg/dL (ref 0.44–1.00)
GFR, Estimated: >60 mL/min (ref >60)
Glucose, Bld: 87 mg/dL (ref 70–99)
Potassium: 3.8 mmol/L (ref 3.5–5.1)
Sodium: 139 mmol/L (ref 135–145)
Total Bilirubin: 0.4 mg/dL (ref 0.3–1.2)
Total Protein: 8.2 g/dL — ABNORMAL HIGH (ref 6.5–8.1)

## 2021-08-12 LAB — DIFFERENTIAL
Abs Immature Granulocytes: 0.03 10*3/uL (ref 0.00–0.07)
Basophils Absolute: 0 10*3/uL (ref 0.0–0.1)
Basophils Relative: 0 %
Eosinophils Absolute: 0.3 10*3/uL (ref 0.0–0.5)
Eosinophils Relative: 3 %
Immature Granulocytes: 0 %
Lymphocytes Relative: 44 %
Lymphs Abs: 3.6 10*3/uL (ref 0.7–4.0)
Monocytes Absolute: 0.6 10*3/uL (ref 0.1–1.0)
Monocytes Relative: 7 %
Neutro Abs: 3.8 10*3/uL (ref 1.7–7.7)
Neutrophils Relative %: 46 %

## 2021-08-12 LAB — I-STAT BETA HCG BLOOD, ED (MC, WL, AP ONLY): I-stat hCG, quantitative: 7.7 m[IU]/mL — ABNORMAL HIGH (ref <5)

## 2021-08-12 LAB — CBC
HCT: 41.4 % (ref 36.0–46.0)
Hemoglobin: 12.9 g/dL (ref 12.0–15.0)
MCH: 28.6 pg (ref 26.0–34.0)
MCHC: 31.2 g/dL (ref 30.0–36.0)
MCV: 91.8 fL (ref 80.0–100.0)
Platelets: 348 10*3/uL (ref 150–400)
RBC: 4.51 MIL/uL (ref 3.87–5.11)
RDW: 14.7 % (ref 11.5–15.5)
WBC: 8.3 10*3/uL (ref 4.0–10.5)
nRBC: 0 % (ref 0.0–0.2)

## 2021-08-12 LAB — I-STAT CHEM 8, ED
BUN: 21 mg/dL — ABNORMAL HIGH (ref 6–20)
Calcium, Ion: 1.12 mmol/L — ABNORMAL LOW (ref 1.15–1.40)
Chloride: 112 mmol/L — ABNORMAL HIGH (ref 98–111)
Creatinine, Ser: 0.8 mg/dL (ref 0.44–1.00)
Glucose, Bld: 84 mg/dL (ref 70–99)
HCT: 40 % (ref 36.0–46.0)
Hemoglobin: 13.6 g/dL (ref 12.0–15.0)
Potassium: 4.2 mmol/L (ref 3.5–5.1)
Sodium: 140 mmol/L (ref 135–145)
TCO2: 22 mmol/L (ref 22–32)

## 2021-08-12 LAB — ETHANOL: Alcohol, Ethyl (B): <10 mg/dL (ref <10)

## 2021-08-12 LAB — APTT: aPTT: 30 seconds (ref 24–36)

## 2021-08-12 LAB — PROTIME-INR
INR: 1 (ref 0.8–1.2)
Prothrombin Time: 13.5 seconds (ref 11.4–15.2)

## 2021-08-12 IMAGING — MR MR MRA HEAD W/O CM
2 series · 19 of 48 positions shown · non-contrast
Comparison: MRI brain [DATE]

CLINICAL DATA: Neuro deficit, acute, stroke suspected

EXAM:
MRI HEAD WITHOUT CONTRAST
MRA HEAD WITHOUT CONTRAST
TECHNIQUE: Multiplanar, multi-echo pulse sequences of the brain and surrounding
structures were acquired without intravenous contrast. Angiographic
images of the Circle of Willis were acquired using MRA technique
without intravenous contrast.

[Series 2: ax (id) · axial · 1.0mm · 0.43mm/px · z∈[-68,+15]mm · 18 of 176 slices shown]
[im 1/176]
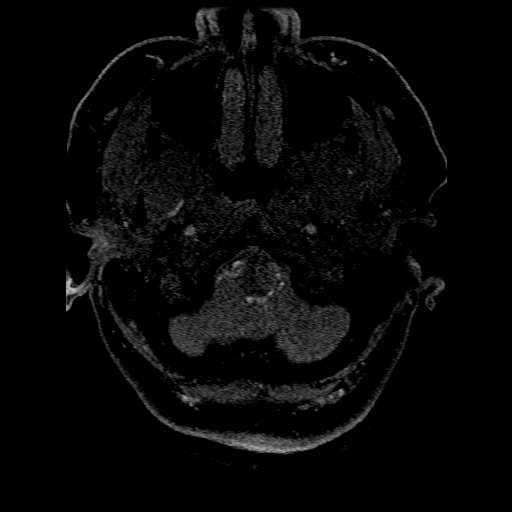
[im 4/176]
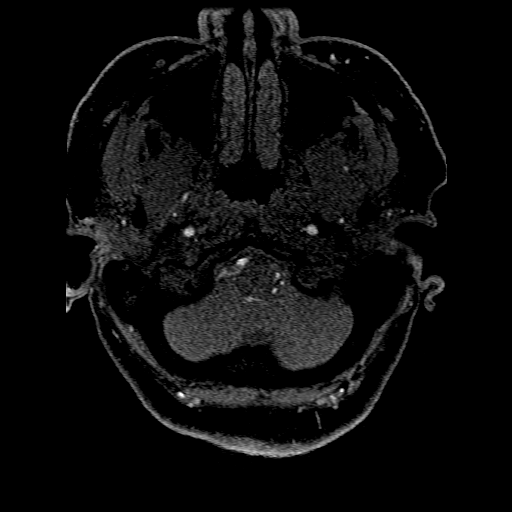
[im 8/176]
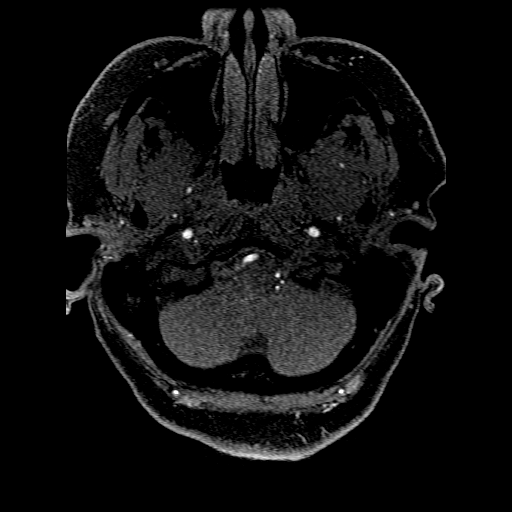
[im 12/176]
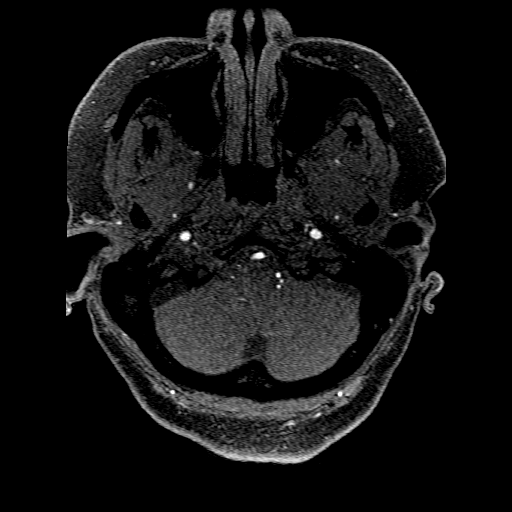
[im 16/176]
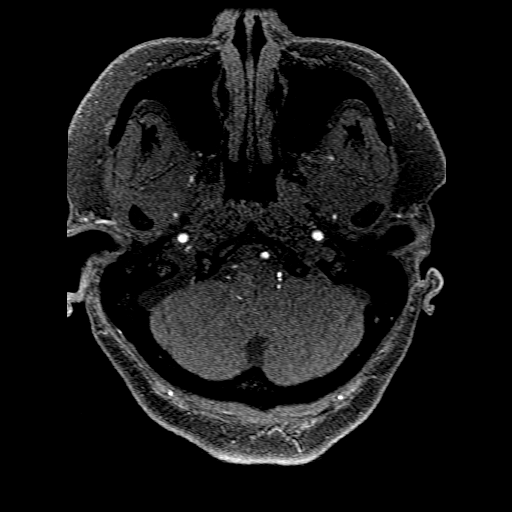
[im 20/176]
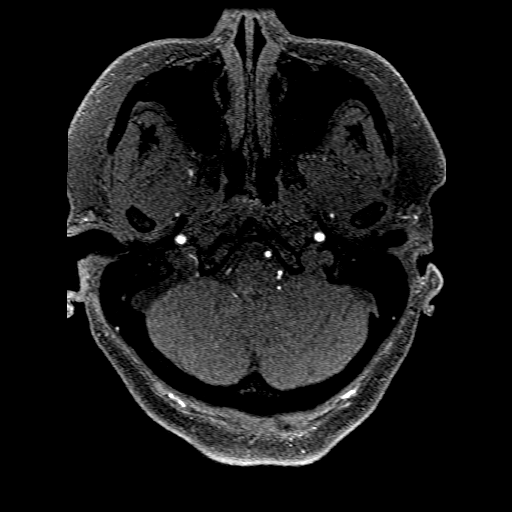
[im 23/176]
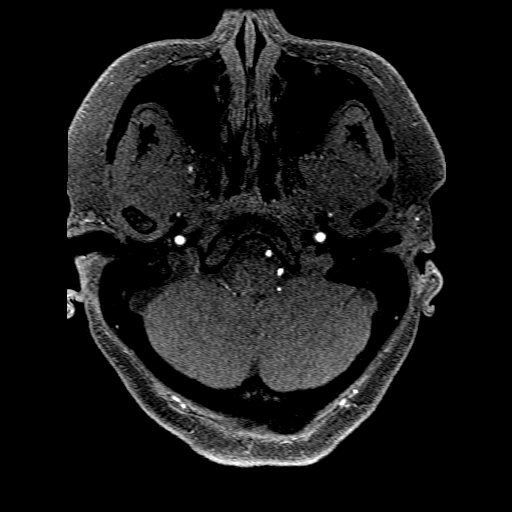
[im 27/176]
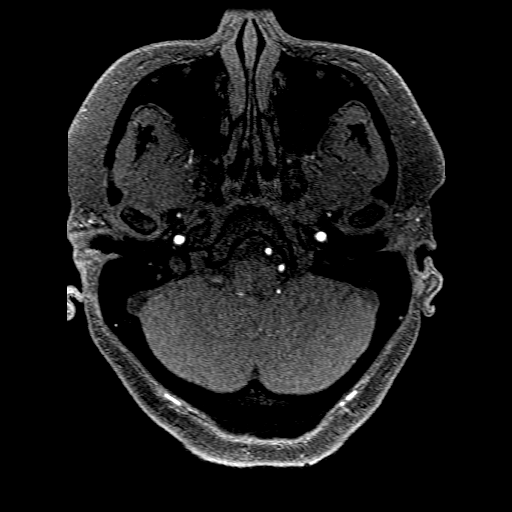
[im 31/176]
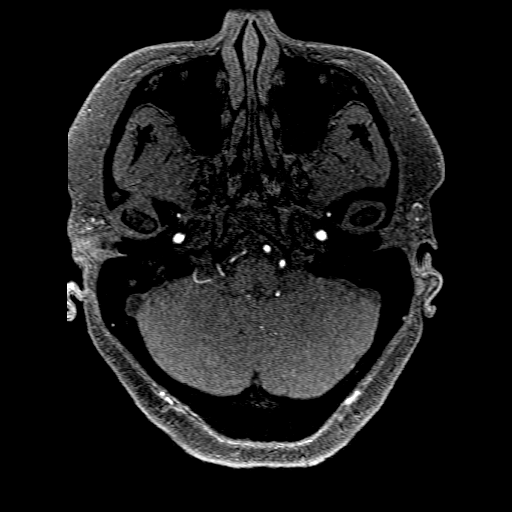
[im 35/176]
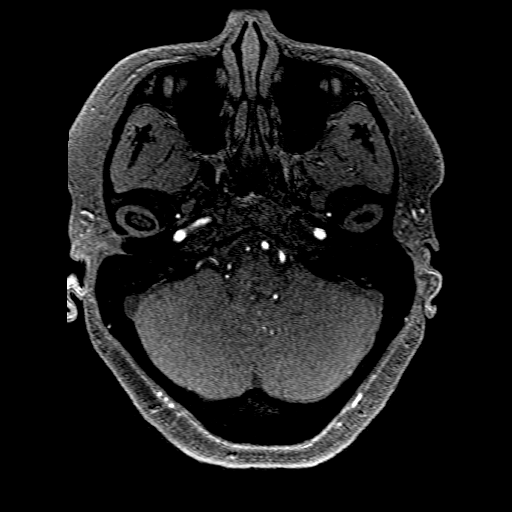
[im 54/176]
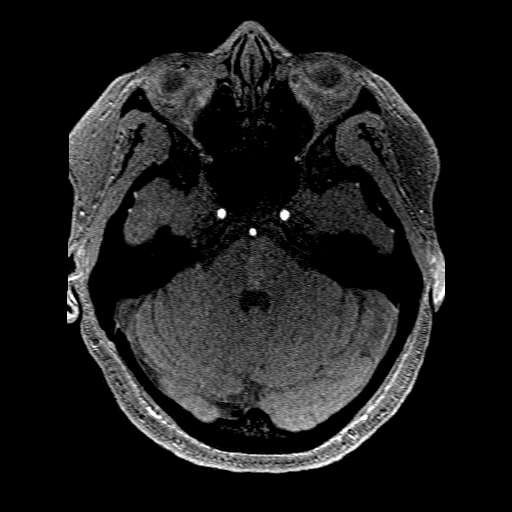
[im 77/176]
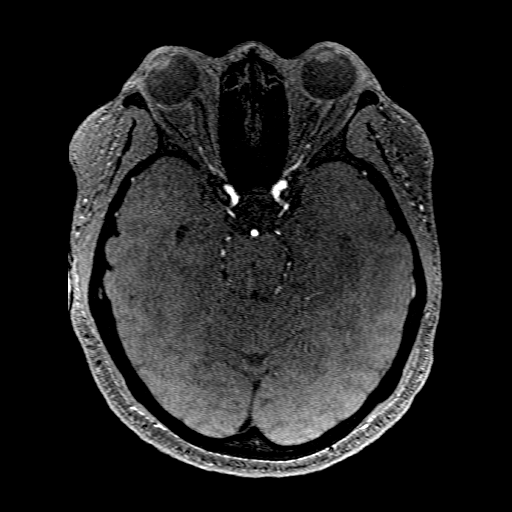
[im 88/176]
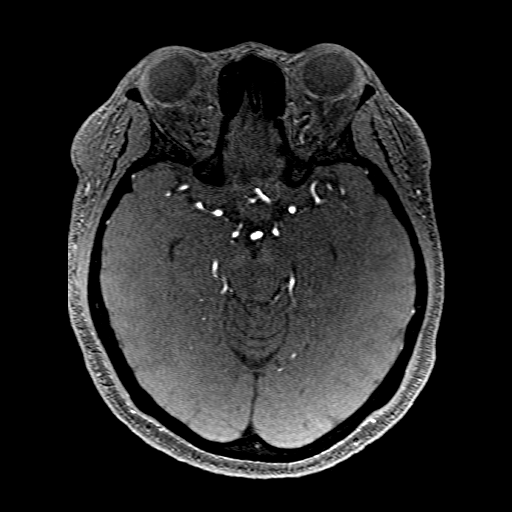
[im 99/176]
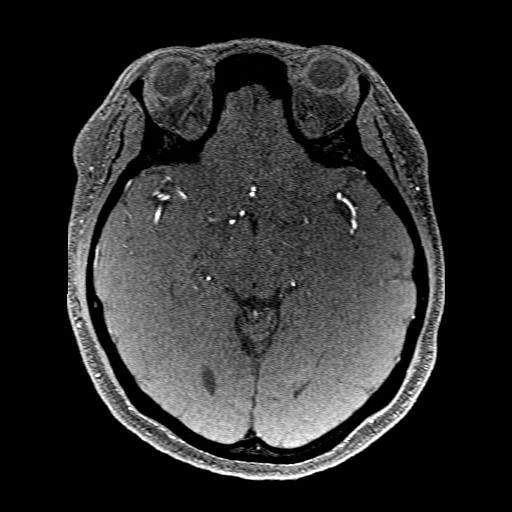
[im 122/176]
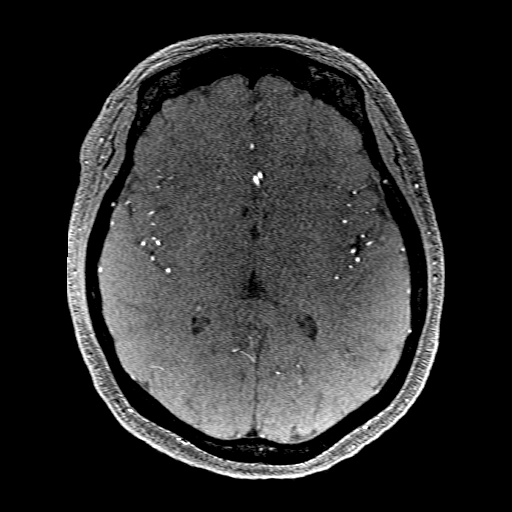
[im 145/176]
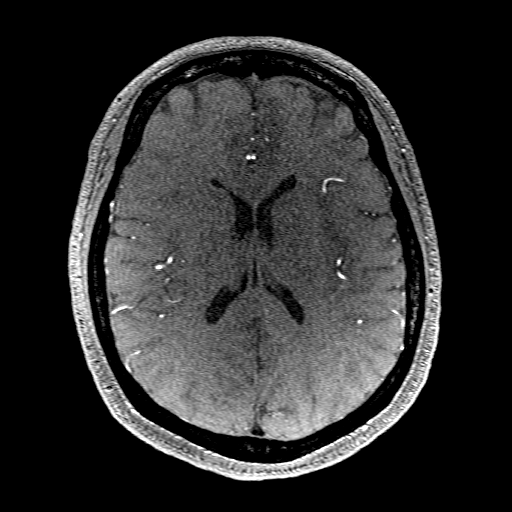
[im 149/176]
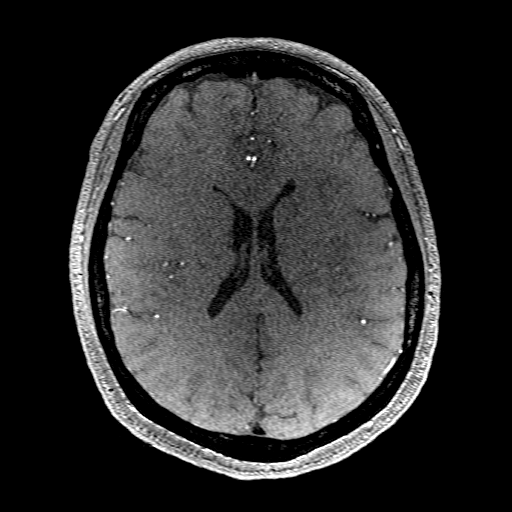
[im 168/176]
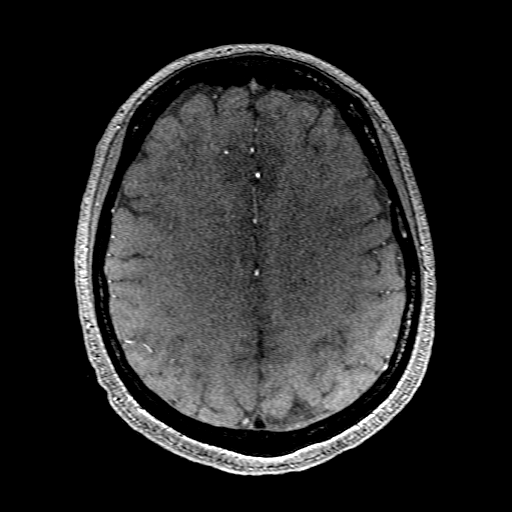

[Series 201: pjn:ax (id) · sagittal · 1.0mm · 0.43mm/px · 1 of 4 slices shown]
[im 1/4]
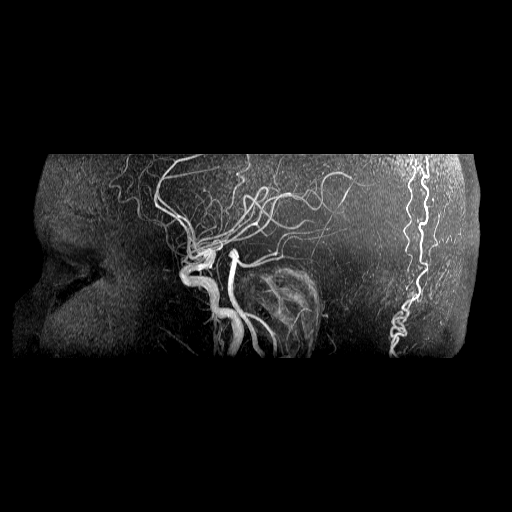

[19 of 48 positions shown; findings below may reference images not displayed]

FINDINGS: MRI HEAD

Brain: There is no acute infarction or intracranial hemorrhage.
There is no intracranial mass, mass effect, or edema. There is no
hydrocephalus or extra-axial fluid collection. Few scattered small
foci of T2 hyperintensity in the supratentorial white matter are
nonspecific but may reflect minor chronic microvascular ischemic
changes.

Vascular: Major vessel flow voids at the skull base are preserved.

Skull and upper cervical spine: Normal marrow signal is preserved.

Sinuses/Orbits: Paranasal sinuses are aerated. Orbits are
unremarkable.

Other: Sella is unremarkable.  Mastoid air cells are clear.

MRA HEAD

Intracranial internal carotid arteries are patent. Middle and
anterior cerebral arteries are patent. Intracranial vertebral
arteries, basilar artery, posterior cerebral arteries are patent.
Bilateral posterior communicating arteries are present. There is no
significant stenosis or aneurysm.
IMPRESSION: No acute infarction, hemorrhage, or mass.

No proximal intracranial vessel occlusion or significant stenosis.

## 2021-08-12 MED ORDER — GADOBUTROL 1 MMOL/ML IV SOLN
10.0000 mL | Freq: Once | INTRAVENOUS | Status: AC | PRN
  Administered 2021-08-12: 10 mL via INTRAVENOUS

## 2021-08-12 NOTE — Discharge Instructions (Addendum)
You were evaluated in the Emergency Department and after careful evaluation, we did not find any emergent condition requiring admission or further testing in the hospital.  Your exam/testing today was overall reassuring.  Please return to the Emergency Department if you experience any worsening of your condition.  Thank you for allowing us to be a part of your care.  

## 2021-08-12 NOTE — ED Triage Notes (Signed)
Pt BIB EMS due to tia s from home. Pt LSN was 1330. Pts  happened for 30 min. Pts NIH is zero. Pt states she has had these episodes 2 months ago. Pt axox4. VSS.   ?

## 2021-08-12 NOTE — ED Provider Notes (Signed)
?  Physical Exam  ?BP (!) 138/93   Pulse 82   Temp 98.4 ?F (36.9 ?C)   Resp 20   SpO2 99%  ? ?Physical Exam ?Vitals and nursing note reviewed.  ?Constitutional:   ?   Appearance: She is obese.  ?Cardiovascular:  ?   Rate and Rhythm: Normal rate and regular rhythm.  ?Neurological:  ?   Mental Status: She is alert.  ? ? ?Procedures  ?Procedures ? ?ED Course / MDM  ? ?Clinical Course as of 08/13/21 0022  ?Thu Aug 12, 2021  ?1512 Hx of TIA and migraines in February. Negative MRI, outpatient follow-up at that time. No Angio done yet.  ? ?Today developed L sided facial droop. 1:30 started for 30 minutes then resolved. Preceding L sided headache. Initially speaking normally. Then developed word findings difficulties. Inconsistent word findings difficulty. F/u labs. CT head at this time, f/u neuro recommendation regarding imaging plans. Spoke with neuro.  [RK]  ?[id number] Neurology recommending MRA head and and neck and MRI brain.  [RK]  ?1832 MRI brain w/ no mass, hemorrhage, or occlusion which is confirmed by radiology.  [RK]  ?1839 Plan for outpatient neurology follow-up given. Strict return precautions given. Pt discharged in stable condition.  [RK]  ?  ?Clinical Course User Index ?[RK] Micheline Maze, MD  ? ?Medical Decision Making ?Amount and/or Complexity of Data Reviewed ?Labs: ordered. ?Radiology: ordered. ? ? ? ? ? ? ?  ?Micheline Maze, MD ?08/13/21 0023 ? ?  ?Gerhard Munch, MD ?08/13/21 0031 ? ?

## 2021-08-12 NOTE — Consult Note (Signed)
NEUROLOGY CONSULTATION NOTE  ? ?Date of service: August 12, 2021 ?Patient Name: Natalie Vasquez ?MRN:  [id number] ?DOB:  03/06/1971 ?Reason for consult: "stuttering, L facial pain" ?Requesting Provider: Carmin Muskrat, MD ? ?History of Present Illness  ?Natalie Vasquez is a 51 y.o. female with a past medical history of MDD, GAD, PTSD, and possible TIA, presenting to the emergency department with an episode of stuttering and left-sided facial pain.  Chart review is notable for hospital stay on 2/2, in which the patient presented with word finding difficulties in the left face and arm numbness (NIHSS of 1), which resolved shortly after CT scan.  MRI was negative.  This episode was labeled "possible TIA cannot exclude anxiety attack" by the attending neurologist.  Of note, the patient recently saw Dr. April Manson as an outpatient on 3/1 for word finding difficulty and brain fog.  At that time she was recommended for a CT angiogram of the head and neck, which was not completed.  Her migraine medications were also adjusted, but the patient reports that she was unable to get these new medicines. ? ?On interview, the patient reports that at 1:30 PM on 3/16 she developed sudden onset stuttering as well as pain that radiated across the left side of her face.  She reports that this was a dull pain radiating down to her jaw.  Regarding the stuttering, she reports this has occurred 3 times.  To another provider, she denies any history of childhood stuttering or speech difficulty.  It is notable, that at the beginning of the interview the patient exhibits a prominent stutter, barely able to produce short phrases.  This stuttering improved significantly as the interview continued, to the point where the patient was speaking in full sentences without stuttering.  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 ?  ?ROS   ? ?Constitutional Denies weight loss, fever and chills.   ?HEENT Denies changes in vision and hearing.   ?Respiratory Denies SOB and cough.   ?CV Denies palpitations and CP   ?GI Denies abdominal pain, nausea, vomiting and diarrhea.   ?GU Denies dysuria and urinary frequency.   ?MSK Denies myalgia and joint pain.   ?Skin Denies rash and pruritus.   ?Neurological Denies syncope.   ?Psychiatric Denies recent changes in mood. Denies anxiety and depression.   ? ?Past History  ? ?Past Medical History:  ?Diagnosis Date  ?? Anxiety   ?? Arthritis   ?? Depression   ?? Fibrosis, breast, left   ?? GERD (gastroesophageal reflux disease)   ?? Graves disease   ?? Hypothyroid   ?? Obstructive sleep apnea   ? uses CPAP at night  ?? Pancreatitis   ?? PTSD (post-traumatic stress disorder)   ?? Urinary incontinence   ? ?Past Surgical History:  ?Procedure Laterality Date  ?? BREAST REDUCTION SURGERY Bilateral 1997  ?? CHOLECYSTECTOMY  1998  ?? KNEE ARTHROSCOPY Left 2017  ?? LIPOSUCTION Bilateral 1997  ? hips and thighs  ? ?History reviewed. No pertinent family history. ?Social History  ? ?Socioeconomic History  ?? Marital status: Single  ?  Spouse name: Not on file  ?? Number of children: Not on file  ?? Years of education: Not on file  ?? Highest education level: Not on file  ?Occupational History  ?? Not on file  ?Tobacco Use  ?? Smoking status: Never  ?? Smokeless tobacco: Never  ?Vaping Use  ??  Vaping Use: Never used  ?Substance and Sexual Activity  ?? Alcohol use: Never  ?? Drug use: Never  ?? Sexual activity: Not Currently  ?Other Topics Concern  ?? Not on file  ?Social History Narrative  ?? Not on file  ? ?Social Determinants of Health  ? ?Financial Resource Strain: Not on file  ?Food Insecurity: Not on file  ?Transportation Needs: Not on file  ?Physical Activity: Not on file  ?Stress: Not on file  ?Social Connections: Not on file  ? ?Allergies  ?Allergen Reactions  ?? Buspirone   ?? Flunisolide   ?? Isoniazid   ?? Menthol    ?? Metoclopramide Other (See Comments)  ?  Dystonia reaction  ?? Quetiapine   ?? Topiramate   ? ? ?Medications  ?(Not in a hospital admission) ?  ? ?Vitals  ? ?Vitals:  ? 08/12/21 1526 08/12/21 1621 08/12/21 1700  ?BP: 131/83 (!) 139/102 (!) 138/93  ?Pulse: 74 72 82  ?Resp: 20 (!) 22 20  ?SpO2: 100% 100% 99%  ?  ? ?There is no height or weight on file to calculate BMI. ? ?Physical Exam  ? ?General: Lying comfortably in bed; in no acute distress.  ?HENT: Normal oropharynx and mucosa. Normal external appearance of ears and nose.  ?Neck: Supple, no pain or tenderness  ?CV: No JVD. No peripheral edema.  ?Pulmonary: Symmetric Chest rise. Normal respiratory effort.  ?Ext: No cyanosis, edema, or deformity  ?Skin: No rash. Normal palpation of skin.   ? ? ?   ?Neurologic Examination  ?Mental status/Cognition: Alert, oriented to self, place, month and year, good attention.  ?Speech/language: stuttering as described above ?Cranial nerves:  ? CN II Pupils equal and reactive to light, no VF deficits   ? CN III,IV,VI EOM intact, no gaze preference or deviation, no nystagmus   ? CN V normal sensation in V1, V2, and V3 segments bilaterally   ? CN VII no asymmetry, no nasolabial fold flattening   ? CN VIII normal hearing to speech   ? CN IX & X normal palatal elevation, no uvular deviation   ? CN XI 5/5 head turn and 5/5 shoulder shrug bilaterally   ? CN XII midline tongue protrusion   ?  ?Motor:  ?Mvmt Root Nerve  Muscle Right Left Comments  ?SA C5/6 Ax Deltoid 5/5 5/5    ?EF C5/6 Mc Biceps 5/5 5/5    ?EE C6/7/8 Rad Triceps 5/5 5/5    ?WF C6/7 Med FCR        ?WE C7/8 PIN ECU        ?F Ab C8/T1 U ADM/FDI        ?HF L1/2/3 Fem Illopsoas 5/5 5/5    ?KE L2/3/4 Fem Quad  5/5 5/5    ?DF L4/5 D Peron Tib Ant        ?PF S1/2 Tibial Grc/Sol        ?  ?Reflexes: ?  Right Left Comments  ?Pectoralis        ? Biceps (C5/6) 2+ 2+    ?Brachioradialis (C5/6) 2+ 2+    ? Triceps (C6/7)      ? Patellar (L3/4) 2+ 1+    ? Achilles (S1) 2+ 2+    ?  Hoffman        ? Plantar        ?Jaw jerk     ?  ?Sensation: ? Light touch intact  ? Pin prick    ? Temperature    ?  Vibration    ?Proprioception    ?  ?Coordination/Complex Motor:  ?- Finger to Nose intact ?- Heel to shin intact ?- Rapid alternating movement intact ?- Gait: deferred ? ? ?Labs  ? ?CBC:  ?Recent Labs  ?Lab 08/12/21 ?1559 08/12/21 ?1634  ?WBC  --  8.3  ?NEUTROABS  --  3.8  ?HGB 13.6 12.9  ?HCT 40.0 41.4  ?MCV  --  91.8  ?PLT  --  348  ? ? ?Basic Metabolic Panel:  ?Lab Results  ?Component Value Date  ? NA 139 08/12/2021  ? K 3.8 08/12/2021  ? CO2 22 08/12/2021  ? GLUCOSE 87 08/12/2021  ? BUN 18 08/12/2021  ? CREATININE 0.90 08/12/2021  ? CALCIUM 9.5 08/12/2021  ? GFRNONAA >60 08/12/2021  ? GFRAA >60 12/15/2018  ? ?Lipid Panel:  ?Lab Results  ?Component Value Date  ? [place] 78 07/28/2021  ? ?HgbA1c:  ?Lab Results  ?Component Value Date  ? HGBA1C 6.1 (H) 07/28/2021  ? ?Urine Drug Screen:  ?   ?Component Value Date/Time  ? [place] DETECTED 07/01/2021 1208  ? COCAINSCRNUR NONE DETECTED 07/01/2021 1208  ? LABBENZ NONE DETECTED 07/01/2021 1208  ? AMPHETMU NONE DETECTED 07/01/2021 1208  ? THCU NONE DETECTED 07/01/2021 1208  ? LABBARB NONE DETECTED 07/01/2021 1208  ?  ?Alcohol Level  ?   ?Component Value Date/Time  ? ETH <10 08/12/2021 1635  ? ? ?No imaging yet ? ?Impression  ? ?Ivyonna Viscuso is a 51 y.o. female with a past medical history of MDD, GAD, PTSD, and possible TIA, presenting to the emergency department with an episode of stuttering and left-sided facial pain.   ? ?The patient's cluster of symptoms may represent a TIA. We will complete the work-up listed below.  If the patient is having recurrent TIAs would expect to see some degree of stenosis on her vessel imaging.  It is also possible that her symptoms represent a conversion disorder, a possibility that is supported by a lack of objective imaging findings and the pattern of her stuttering.  ? ?Recommendations  ? ?-MRI-Brain ?-MR neck w  wo ?-MR head wo contrast ?-Consider Echo ?-Recommended that she discuss her neurological work ups with her therapist given her psychosocial stressors and their possible contribution.  ? ? ?Corky Sox, MD ?PGY-1 ? ?I h

## 2021-08-12 NOTE — ED Provider Notes (Signed)
?[facility] ?Provider Note ? ? ?CSN: [id number] ?Arrival date & time: 08/12/21  1419 ? ?  ? ?History ? ?Chief Complaint  ?Patient presents with  ? Transient Ischemic Attack  ? ? ?Natalie Vasquez is a 51 y.o. female. ? ?Natalie Vasquez is a 51 y.o. female with a history of TBI, migraine, Graves' disease, GERD, anxiety, depression, PTSD, who presents to the emergency department via EMS for evaluation of possible TIA.  Patient reports at 1:30 today she developed a mild left-sided headache and then started spearing seeing left-sided facial droop and slurred speech and reports difficulty finding her words.  Patient reports within 30 minutes the symptoms completely resolved.  She reports she had a very similar presentation in February and was evaluated for a TIA, at that time had a negative MRI and was discharged home for outpatient follow-up with neurology.  She was seen by Dr. Teresa Coombsamara on 3/1 and is supposed to be having a CTA of the head and neck done next week.  She also recently had her migraine medications changed and was tapered off of topiramate and started on Nurtec.  While speaking with the patient about her symptoms initially she had normal speech but as I started to ask her about her speech changes she started to have some recurrence of slurred speech and difficulty with word finding but no associated facial droop.  She reports with this episode today she had no associated vision changes, dizziness, numbness or weakness. ? ?The history is provided by the patient.  ? ?  ? ?Home Medications ?Prior to Admission medications   ?Medication Sig Start Date End Date Taking? Authorizing Provider  ?aspirin 81 MG chewable tablet Chew 1 tablet (81 mg total) by mouth daily. 07/01/21   Charlynne PanderYao, David Hsienta, MD  ?DULoxetine (CYMBALTA) 60 MG capsule Take 120 mg by mouth at bedtime. 04/07/21   [provider]  ?levothyroxine (SYNTHROID) 100 MCG tablet Take 1 tablet by mouth daily.     [provider]  ?levothyroxine (SYNTHROID) 75 MCG tablet Take 75 mcg by mouth daily before breakfast.    [provider]  ?lidocaine (LIDODERM) 5 % 1 patch daily as needed (pain). 03/09/21   [provider]  ?meloxicam (MOBIC) 15 MG tablet Take 15 mg by mouth 2 (two) times daily.    [provider]  ?oxybutynin (DITROPAN-XL) 10 MG 24 hr tablet Take 5 mg by mouth 2 (two) times daily.    [provider]  ?pantoprazole (PROTONIX) 40 MG tablet Take 40 mg by mouth at bedtime.    [provider]  ?REFRESH 1.4-0.6 % SOLN Apply 1 drop to eye daily as needed (dry eyes). 03/26/21   [provider]  ?Rimegepant Sulfate (NURTEC) 75 MG TBDP Take 75 mg by mouth every other day. 07/28/21 08/27/21  Windell Norfolkamara, Amadou, MD  ?SUMAtriptan (IMITREX) 50 MG tablet Take 1 tablet (50 mg total) by mouth every 2 (two) hours as needed for migraine. May repeat in 2 hours if headache persists or recurs. 07/28/21   Windell Norfolkamara, Amadou, MD  ?traMADol (ULTRAM) 50 MG tablet Take 50 mg by mouth every 6 (six) hours as needed for moderate pain. 03/27/21   [provider]  ?traZODone (DESYREL) 100 MG tablet Take 50-100 mg by mouth at bedtime as needed for sleep.    [provider]  ?   ? ?Allergies    ?Buspirone, Flunisolide, Isoniazid, Menthol, Metoclopramide, Quetiapine, and Topiramate   ? ?Review of Systems   ?  Review of Systems  ?Constitutional:  Negative for chills and fever.  ?HENT: Negative.    ?Eyes:  Negative for visual disturbance.  ?Respiratory:  Negative for cough and shortness of breath.   ?Cardiovascular:  Negative for chest pain.  ?Gastrointestinal:  Negative for abdominal pain, nausea and vomiting.  ?Genitourinary:  Negative for dysuria and frequency.  ?Musculoskeletal:  Negative for arthralgias and myalgias.  ?Skin:  Negative for color change and rash.  ?Neurological:  Positive for facial asymmetry, speech difficulty and headaches. Negative for dizziness, seizures,  syncope, weakness, light-headedness and numbness.  ? ?Physical Exam ?Updated Vital Signs ?BP 131/83 (BP Location: Right Arm)   Pulse 74   Resp 20   SpO2 100%  ?Physical Exam ?Vitals and nursing note reviewed.  ?Constitutional:   ?   General: She is not in acute distress. ?   Appearance: Normal appearance. She is well-developed. She is not ill-appearing or diaphoretic.  ?HENT:  ?   Head: Normocephalic and atraumatic.  ?Eyes:  ?   General:     ?   Right eye: No discharge.     ?   Left eye: No discharge.  ?   Extraocular Movements: Extraocular movements intact.  ?   Pupils: Pupils are equal, round, and reactive to light.  ?Cardiovascular:  ?   Rate and Rhythm: Normal rate and regular rhythm.  ?   Pulses: Normal pulses.  ?   Heart sounds: Normal heart sounds.  ?Pulmonary:  ?   Effort: Pulmonary effort is normal. No respiratory distress.  ?   Breath sounds: Normal breath sounds. No wheezing or rales.  ?   Comments: Respirations equal and unlabored, patient able to speak in full sentences, lungs clear to auscultation bilaterally  ?Abdominal:  ?   General: Bowel sounds are normal. There is no distension.  ?   Palpations: Abdomen is soft. There is no mass.  ?   Tenderness: There is no abdominal tenderness. There is no guarding.  ?   Comments: Abdomen soft, nondistended, nontender to palpation in all quadrants without guarding or peritoneal signs  ?Musculoskeletal:     ?   General: No deformity.  ?   Cervical back: Neck supple.  ?Skin: ?   General: Skin is warm and dry.  ?   Capillary Refill: Capillary refill takes less than 2 seconds.  ?Neurological:  ?   Mental Status: She is alert and oriented to person, place, and time.  ?   Coordination: Coordination normal.  ?   Comments: Initially patient had clear speech, but in speaking with her more she started to develop some slurring and word finding difficult but this seems to be inconsistent. ?CN III-XII intact ?Normal strength in upper and lower extremities bilaterally  including dorsiflexion and plantar flexion, strong and equal grip strength ?Sensation normal to light and sharp touch ?Moves extremities without ataxia, coordination intact ?Normal finger-to-nose testing bilaterally, no pronator drift.  ?Psychiatric:     ?   Mood and Affect: Mood normal.     ?   Behavior: Behavior normal.  ? ? ?ED Results / Procedures / Treatments   ?Labs ?(all labs ordered are listed, but only abnormal results are displayed) ?Labs Reviewed  ?ETHANOL  ?PROTIME-INR  ?APTT  ?CBC  ?DIFFERENTIAL  ?COMPREHENSIVE METABOLIC PANEL  ?RAPID URINE DRUG SCREEN, HOSP PERFORMED  ?URINALYSIS, ROUTINE W REFLEX MICROSCOPIC  ?I-STAT CHEM 8, ED  ?I-STAT BETA HCG BLOOD, ED ([facility], [facility], [facility] ONLY)  ? ? ?EKG ?None ? ?Radiology ?No  results found. ? ?Procedures ?Procedures  ? ? ?Medications Ordered in ED ?Medications - No data to display ? ?ED Course/ Medical Decision Making/ A&P ? ?51 y.o. female presents to the ED with complaints of possible TIA with left facial droop and speech changes that lasted for 30 minutes and then resolved, this involves an extensive number of treatment options, and is a complaint that carries with it a high risk of complications and morbidity.  Differential includes: TIA, stroke, complicated migraine, psychogenic deficit, electrolyte derangement, infection ? ? ?On arrival pt is nontoxic, vitals wnl. Exam on arrival with no focal neurologic deficits, as I began to talk with patient more about her speech changes during this episode she started to experience some speech slurring and word finding difficulties but these changes were inconsistent when asked to say her name she was able to say it without any issue. ? ?Additional history obtained from chart review. Previous records obtained and reviewed including prior ED visit for TIA symptoms and recent neurology follow-up. ? ?Given recurrence of speech changes I consulted neurology and discussed case with Dr. Amada Jupiter, I was able to go to the bedside  and reevaluate patient's speech changes with him on the phone.  He feels speech changes may be more likely psychogenic at this time patient does not have any associated facial droop or any other neurologic defici

## 2021-08-12 NOTE — ED Notes (Signed)
Patient transported to MRI 

## 2021-08-18 ENCOUNTER — Other Ambulatory Visit: Payer: No Typology Code available for payment source

## 2021-08-18 ENCOUNTER — Inpatient Hospital Stay: Admission: RE | Admit: 2021-08-18 | Payer: No Typology Code available for payment source | Source: Ambulatory Visit

## 2021-11-10 ENCOUNTER — Telehealth: Payer: Self-pay | Admitting: Hematology and Oncology

## 2021-11-10 NOTE — Telephone Encounter (Signed)
Scheduled appt per 6/13 referral. Pt is aware of appt date and time. Pt is aware to arrive 15 mins prior to appt time and to bring and updated insurance card. Pt is aware of appt location.   

## 2021-11-24 ENCOUNTER — Telehealth: Payer: Self-pay | Admitting: Hematology and Oncology

## 2021-11-24 NOTE — Telephone Encounter (Signed)
Called per pt request,leftvmail

## 2021-11-25 NOTE — Progress Notes (Signed)
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 female is here because of recent diagnosis of transient cerebral attacks. She presents to the clinic for a consult and labs.  About 5 years ago she had an episode of TIA but did not see any doctors she had numbness of the face and weakness of the arm but that subsided after she rested and got better.  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  Most recently after an stressful episode with the patient she developed similar symptoms which led to her going to the hospital and extensive work-up did not reveal any stroke but her symptoms resolved with improvement in the slurring of speech and she was able to be discharged home.  She was on aspirin when she developed similar episodes once again and back to once again hospitalization and discharge.  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  She was referred to [place] for discussion regarding this finding.  I reviewed her records extensively and collaborated the history with the patient.  MEDICAL HISTORY:  Past Medical History:  Diagnosis Date   Anxiety    Arthritis    Depression    Fibrosis, breast, left    GERD (gastroesophageal reflux disease)    Graves disease    Hypothyroid    Obstructive sleep apnea    uses CPAP at night   Pancreatitis    PTSD (post-traumatic stress disorder)    Urinary incontinence     SURGICAL HISTORY: Past Surgical History:  Procedure Laterality Date   BREAST REDUCTION SURGERY Bilateral 1997   CHOLECYSTECTOMY  1998   KNEE ARTHROSCOPY Left 2017   LIPOSUCTION Bilateral 1997   hips and thighs    SOCIAL HISTORY: Social History    Socioeconomic History   Marital status: Single    Spouse name: Not on file   Number of children: Not on file   Years of education: Not on file   Highest education level: Not on file  Occupational History   Not on file  Tobacco Use   Smoking status: Never   Smokeless tobacco: Never  Vaping Use   Vaping Use: Never used  Substance and Sexual Activity   Alcohol use: Never   Drug use: Never   Sexual activity: Not Currently  Other Topics Concern   Not on file  Social History Narrative   Not on file   Social Determinants of Health   Financial Resource Strain: Not on file  Food Insecurity: Not on file  Transportation Needs: Not on file  Physical Activity: Not on file  Stress: Not on file  Social Connections: Not on file  Intimate Partner Violence: Not on file    FAMILY HISTORY: No family history on file.  ALLERGIES:  is allergic to buspirone, flunisolide, isoniazid, menthol, metoclopramide, quetiapine, and topiramate.  MEDICATIONS:  Current Outpatient Medications  Medication Sig Dispense Refill   RIVAROXABAN (XARELTO) VTE STARTER PACK (15 & 20 MG) Follow package directions: Take one 15mg  tablet by mouth twice a day. On day 22, switch to one 20mg  tablet once a day. Take with food. 51 each 0   aspirin 81 MG chewable tablet Chew 1 tablet (81 mg total)  by mouth daily. 30 tablet 0   DULoxetine (CYMBALTA) 60 MG capsule Take 120 mg by mouth at bedtime.     levothyroxine (SYNTHROID) 100 MCG tablet Take 1 tablet by mouth daily. Take along with 75 mcg tablet=175 mcg     levothyroxine (SYNTHROID) 75 MCG tablet Take 75 mcg by mouth daily before breakfast. Take along with 100 mcg tablet=175 mcg     lidocaine (LIDODERM) 5 % Place 1 patch onto the skin daily as needed (pain).     meloxicam (MOBIC) 15 MG tablet Take 15 mg by mouth 2 (two) times daily.     oxybutynin (DITROPAN-XL) 10 MG 24 hr tablet Take 5 mg by mouth 2 (two) times daily.     pantoprazole (PROTONIX) 40 MG tablet Take 40  mg by mouth at bedtime.     REFRESH 1.4-0.6 % SOLN Apply 1 drop to eye daily as needed (dry eyes).     SUMAtriptan (IMITREX) 50 MG tablet Take 1 tablet (50 mg total) by mouth every 2 (two) hours as needed for migraine. May repeat in 2 hours if headache persists or recurs. 10 tablet 6   traMADol (ULTRAM) 50 MG tablet Take 50 mg by mouth every 6 (six) hours as needed for moderate pain.     traZODone (DESYREL) 100 MG tablet Take 100 mg by mouth at bedtime.     No current facility-administered medications for this visit.    REVIEW OF SYSTEMS:   Constitutional: Denies fevers, chills or abnormal night sweats Walks with the help of a cane All other systems were reviewed with the patient and are negative.  PHYSICAL EXAMINATION: ECOG PERFORMANCE STATUS: 1 - Symptomatic but completely ambulatory  Vitals:   12/09/21 1544  BP: (!) 140/99  Pulse: 78  Resp: 15  Temp: (!) 97.5 F (36.4 C)  SpO2: 98%   Filed Weights   12/09/21 1544  Weight: 267 lb 11.2 oz (121.4 kg)    Neuro: Occasional word finding difficulty.  LABORATORY DATA:  I have reviewed the data as listed Lab Results  Component Value Date   WBC 8.3 08/12/2021   HGB 12.9 08/12/2021   HCT 41.4 08/12/2021   MCV 91.8 08/12/2021   PLT 348 08/12/2021   Lab Results  Component Value Date   NA 139 08/12/2021   K 3.8 08/12/2021   CL 108 08/12/2021   CO2 22 08/12/2021    RADIOGRAPHIC STUDIES: I have personally reviewed the radiological reports and agreed with the findings in the report.  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  I recommended starting the patient on anticoagulation with Xarelto for life. I counseled the patient about risks and benefits of Xarelto including the risk of bleeding.  She is also taking aspirin for stroke prevention. I discussed with her that protein S deficiency can lead  to arterial as well as venous thromboembolic disease. [facility]  Return to clinic once a year with a telephone visit.  If she is able to get the prescription from her [place] physicians then we can see her on an as-needed basis.   All questions were answered. The patient knows to call the clinic with any problems, questions or concerns.    Tamsen Meek, MD 12/09/21  I Janan Ridge am scribing for Dr. Pamelia Hoit  I have reviewed the above documentation for accuracy and completeness,  and I agree with the above.

## 2021-12-09 ENCOUNTER — Inpatient Hospital Stay
Payer: No Typology Code available for payment source | Attending: Hematology and Oncology | Admitting: Hematology and Oncology

## 2021-12-09 DIAGNOSIS — Z8673 Personal history of transient ischemic attack (TIA), and cerebral infarction without residual deficits: Secondary | ICD-10-CM | POA: Diagnosis not present

## 2021-12-09 DIAGNOSIS — D6859 Other primary thrombophilia: Secondary | ICD-10-CM

## 2021-12-09 MED ORDER — RIVAROXABAN (XARELTO) VTE STARTER PACK (15 & 20 MG): ORAL_TABLET | ORAL | 0 refills | Status: DC

## 2022-01-05 ENCOUNTER — Encounter (HOSPITAL_COMMUNITY): Payer: Self-pay | Admitting: Emergency Medicine

## 2022-01-05 ENCOUNTER — Emergency Department (HOSPITAL_COMMUNITY): Payer: No Typology Code available for payment source

## 2022-01-05 ENCOUNTER — Other Ambulatory Visit: Payer: Self-pay

## 2022-01-05 ENCOUNTER — Emergency Department (HOSPITAL_COMMUNITY)
Admission: EM | Admit: 2022-01-05 | Discharge: 2022-01-05 | Disposition: A | Discharge status: Home / Self Care | Payer: No Typology Code available for payment source | Attending: Emergency Medicine | Admitting: Emergency Medicine

## 2022-01-05 DIAGNOSIS — Z8673 Personal history of transient ischemic attack (TIA), and cerebral infarction without residual deficits: Secondary | ICD-10-CM | POA: Insufficient documentation

## 2022-01-05 DIAGNOSIS — Z7982 Long term (current) use of aspirin: Secondary | ICD-10-CM | POA: Diagnosis not present

## 2022-01-05 DIAGNOSIS — N939 Abnormal uterine and vaginal bleeding, unspecified: Secondary | ICD-10-CM | POA: Diagnosis present

## 2022-01-05 DIAGNOSIS — R5383 Other fatigue: Secondary | ICD-10-CM | POA: Insufficient documentation

## 2022-01-05 DIAGNOSIS — R531 Weakness: Secondary | ICD-10-CM | POA: Diagnosis not present

## 2022-01-05 DIAGNOSIS — E039 Hypothyroidism, unspecified: Secondary | ICD-10-CM | POA: Insufficient documentation

## 2022-01-05 DIAGNOSIS — R42 Dizziness and giddiness: Secondary | ICD-10-CM | POA: Diagnosis not present

## 2022-01-05 LAB — CBC WITH DIFFERENTIAL/PLATELET
Abs Immature Granulocytes: 0.02 10*3/uL (ref 0.00–0.07)
Basophils Absolute: 0 10*3/uL (ref 0.0–0.1)
Basophils Relative: 1 %
Eosinophils Absolute: 0.2 10*3/uL (ref 0.0–0.5)
Eosinophils Relative: 4 %
HCT: 34.5 % — ABNORMAL LOW (ref 36.0–46.0)
Hemoglobin: 11 g/dL — ABNORMAL LOW (ref 12.0–15.0)
Immature Granulocytes: 0 %
Lymphocytes Relative: 45 %
Lymphs Abs: 3 10*3/uL (ref 0.7–4.0)
MCH: 27.8 pg (ref 26.0–34.0)
MCHC: 31.9 g/dL (ref 30.0–36.0)
MCV: 87.3 fL (ref 80.0–100.0)
Monocytes Absolute: 0.6 10*3/uL (ref 0.1–1.0)
Monocytes Relative: 9 %
Neutro Abs: 2.6 10*3/uL (ref 1.7–7.7)
Neutrophils Relative %: 41 %
Platelets: 378 10*3/uL (ref 150–400)
RBC: 3.95 MIL/uL (ref 3.87–5.11)
RDW: 17.3 % — ABNORMAL HIGH (ref 11.5–15.5)
WBC: 6.4 10*3/uL (ref 4.0–10.5)
nRBC: 0 % (ref 0.0–0.2)

## 2022-01-05 LAB — TYPE AND SCREEN
ABO/RH(D): O POS
Antibody Screen: NEGATIVE

## 2022-01-05 LAB — BASIC METABOLIC PANEL
Anion gap: 10 (ref 5–15)
BUN: 8 mg/dL (ref 6–20)
CO2: 21 mmol/L — ABNORMAL LOW (ref 22–32)
Calcium: 9.2 mg/dL (ref 8.9–10.3)
Chloride: 106 mmol/L (ref 98–111)
Creatinine, Ser: 0.93 mg/dL (ref 0.44–1.00)
GFR, Estimated: >60 mL/min (ref >60)
Glucose, Bld: 88 mg/dL (ref 70–99)
Potassium: 3.9 mmol/L (ref 3.5–5.1)
Sodium: 137 mmol/L (ref 135–145)

## 2022-01-05 LAB — BRAIN NATRIURETIC PEPTIDE: B Natriuretic Peptide: 20 pg/mL (ref 0.0–100.0)

## 2022-01-05 LAB — PROTIME-INR
INR: 2 — ABNORMAL HIGH (ref 0.8–1.2)
Prothrombin Time: 22.7 seconds — ABNORMAL HIGH (ref 11.4–15.2)

## 2022-01-05 LAB — ABO/RH: ABO/RH(D): O POS

## 2022-01-05 MED ORDER — MEDROXYPROGESTERONE ACETATE 5 MG PO TABS: 10.0000 mg | ORAL_TABLET | Freq: Three times a day (TID) | ORAL | 0 refills | Status: AC

## 2022-01-05 MED ORDER — SODIUM CHLORIDE 0.9 % IV BOLUS
1000.0000 mL | Freq: Once | INTRAVENOUS | Status: AC
Start: 2022-01-05 — End: 2022-01-05
  Administered 2022-01-05: 1000 mL via INTRAVENOUS

## 2022-01-05 MED ORDER — MEDROXYPROGESTERONE ACETATE 10 MG PO TABS
10.0000 mg | ORAL_TABLET | Freq: Every day | ORAL | Status: AC
Start: 2022-01-05 — End: 2022-01-05
  Administered 2022-01-05: 10 mg via ORAL
  Filled 2022-01-05: qty 1

## 2022-01-05 NOTE — ED Triage Notes (Signed)
Pt with shob and heavy vaginal bleeding. Started on xarelto recently.  States "feels like I am drunk"

## 2022-01-05 NOTE — ED Provider Notes (Signed)
[facility] EMERGENCY DEPARTMENT Provider Note   CSN: [id number] Arrival date & time: 01/05/22  1136     History PMH: Hypothyroidism, PTSD, OSA, GERD, Depression, Protein S deficiency, TIA Chief Complaint  Patient presents with  . Vaginal Bleeding    Natalie Vasquez is a 51 y.o. female.  Patient presents with vaginal bleeding for one week.   She said that over the past year she had multiple TIAs and was diagnosed with protein S deficiency. About one month ago, she was started on xaralto which she has been compliant with.   One week ago she started experiencing vaginal bleeding. Bleeding has been heavy with clots. She has changed her pad about 3-4 times per day. Today she started experiencing increased fatigue, weakness, and dizziness especially when standing and walking around. When she stands up, she noticed her heart racing and it gets to about 110's. She has been drinking a lot of fluids. She previously was having regular menstrual cycles until December of 2022 which she suddenly stopped having periods. [facility] not use a dedicated [facility]YN provider.   To me, she denies any chest pain, shortness of breath, diarrhea, constipation, nausea, vomiting, hematuria, flank pain, dysuria, fevers, cough, congestion, sore throat, etc.       Vaginal Bleeding Associated symptoms: dizziness and fatigue   Associated symptoms: no abdominal pain, no dysuria and no fever        Home Mications Prior to Admission medications   Medication Sig Start Date End Date Taking? Authorizing Provider  medroxyPROGESTERone (PROVERA) 5 MG tablet Take 2 tablets (10 mg total) by mouth every 8 (eight) hours for 14 days. 01/05/22 01/19/22 Yes Kenedy Haisley, Finis Bud, PA-C  aspirin 81 MG chewable tablet Chew 1 tablet (81 mg total) by mouth daily. 07/01/21   Charlynne Pander, MD  DULoxetine (CYMBALTA) 60 MG capsule Take 120 mg by mouth at bedtime. 04/07/21   [provider]  levothyroxine (SYNTHROID) 100 MCG tablet Take 1 tablet by mouth daily. Take along with 75 mcg tablet=175 mcg    [provider]  levothyroxine (SYNTHROID) 75 MCG tablet Take 75 mcg by mouth daily before breakfast. Take along with 100 mcg tablet=175 mcg    [provider]  lidocaine (LIDODERM) 5 % Place 1 patch onto the skin daily as needed (pain). 03/09/21   [provider]  meloxicam (MOBIC) 15 MG tablet Take 15 mg by mouth 2 (two) times daily.    [provider]  oxybutynin (DITROPAN-XL) 10 MG 24 hr tablet Take 5 mg by mouth 2 (two) times daily.    [provider]  pantoprazole (PROTONIX) 40 MG tablet Take 40 mg by mouth at bedtime.    [provider]  REFRESH 1.4-0.6 % SOLN Apply 1 drop to eye daily as needed (dry eyes). 03/26/21   [provider]  RIVAROXABAN Carlena Hurl) VTE STARTER PACK (15 & 20 MG) Follow package directions: Take one 15mg  tablet by mouth twice a day. On day 22, switch to one 20mg  tablet once a day. Take with food. 12/09/21   , MD  SUMAtriptan (IMITREX) 50 MG tablet Take 1 tablet (50 mg total) by mouth every 2 (two) hours as needed for migraine. May repeat in 2 hours if headache persists or recurs. 07/28/21   Serena Croissant, MD  traMADol (ULTRAM) 50 MG tablet Take 50 mg by mouth every 6 (six) hours as needed for moderate pain. 03/27/21   [provider]  traZODone (DESYREL) 100 MG tablet Take 100 mg by mouth at bedtime.    [provider]      Allergies    Buspirone, Flunisolide, Isoniazid, Menthol, Metoclopramide, Quetiapine, and Topiramate    Review of Systems   Review of Systems  Constitutional:  Positive for fatigue. Negative for fever.  HENT:  Negative for congestion and sore throat.   Respiratory:  Negative for cough and shortness of breath.   Cardiovascular:  Negative for chest pain.  Gastrointestinal:  Negative for abdominal pain.  Genitourinary:  Positive for vaginal  bleeding. Negative for dysuria and flank pain.  Neurological:  Positive for dizziness and weakness.  All other systems reviewed and are negative.   Physical Exam Updated Vital Signs BP 123/75   Pulse 64   Temp 97.9 F (36.6 C) (Oral)   Resp 18   SpO2 100%  Physical Exam Vitals and nursing note reviewed.  Constitutional:      General: She is not in acute distress.    Appearance: Normal appearance. She is not ill-appearing, toxic-appearing or diaphoretic.  HENT:     Head: Normocephalic and atraumatic.     Nose: No nasal deformity.     Mouth/Throat:     Lips: Pink. No lesions.     Mouth: Mucous membranes are moist. No injury, lacerations, oral lesions or angioedema.     Pharynx: Oropharynx is clear. Uvula midline. No pharyngeal swelling, oropharyngeal exudate, posterior oropharyngeal erythema or uvula swelling.  Eyes:     General: Gaze aligned appropriately. No scleral icterus.       Right eye: No discharge.        Left eye: No discharge.     Conjunctiva/sclera: Conjunctivae normal.     Right eye: Right conjunctiva is not injected. No exudate or hemorrhage.    Left eye: Left conjunctiva is not injected. No exudate or hemorrhage. Cardiovascular:     Rate and Rhythm: Normal rate and regular rhythm.     Pulses: Normal pulses.          Radial pulses are 2+ on the right side and 2+ on the left side.       Dorsalis pedis pulses are 2+ on the right side and 2+ on the left side.     Heart sounds: Normal heart sounds, S1 normal and S2 normal. Heart sounds not distant. No murmur heard.    No friction rub. No gallop. No S3 or S4 sounds.  Pulmonary:     Effort: Pulmonary effort is normal. No accessory muscle usage or respiratory distress.     Breath sounds: Normal breath sounds. No stridor. No wheezing, rhonchi or rales.  Chest:     Chest wall: No tenderness.  Abdominal:     General: Abdomen is flat. There is no distension.     Palpations: Abdomen is soft. There is no mass or pulsatile  mass.     Tenderness: There is no abdominal tenderness. There is no right CVA tenderness, left CVA tenderness, guarding or rebound.  Musculoskeletal:     Right lower leg: No edema.     Left lower leg: No edema.  Skin:    General: Skin is warm and dry.     Coloration: Skin is not jaundiced or pale.     Findings: No bruising, erythema, lesion or rash.  Neurological:     General: No focal deficit present.     Mental Status: She is alert and oriented to person, place, and time.  GCS: GCS eye subscore is 4. GCS verbal subscore is 5. GCS motor subscore is 6.  Psychiatric:        Mood and Affect: Mood normal.        Behavior: Behavior normal. Behavior is cooperative.     ED Results / Procedures / Treatments   Labs (all labs ordered are listed, but only abnormal results are displayed) Labs Reviewed  BASIC METABOLIC PANEL - Abnormal; Notable for the following components:      Result Value   CO2 21 (*)    All other components within normal limits  CBC WITH DIFFERENTIAL/PLATELET - Abnormal; Notable for the following components:   Hemoglobin 11.0 (*)    HCT 34.5 (*)    RDW 17.3 (*)    All other components within normal limits  PROTIME-INR - Abnormal; Notable for the following components:   Prothrombin Time 22.7 (*)    INR 2.0 (*)    All other components within normal limits  BRAIN NATRIURETIC PEPTIDE  I-STAT BETA HCG BLOOD, ED ([facility], [facility], [facility] ONLY)  TYPE AND SCREEN  ABO/RH    EKG None  Radiology CT HEAD WO CONTRAST ( )  Result Date: 01/05/2022 CLINICAL DATA:  Dizziness, non-specific EXAM: CT HEAD WITHOUT CONTRAST TECHNIQUE: Contiguous axial images were obtained from the base of the skull through the vertex without intravenous contrast. RADIATION DOSE REDUCTION: This exam was performed according to the departmental dose-optimization program which includes automated exposure control, adjustment of the mA and/or kV according to patient size and/or use of iterative reconstruction  technique. COMPARISON:  CT head July 01, 2021. FINDINGS: Brain: No evidence of acute infarction, hemorrhage, hydrocephalus, extra-axial collection or mass lesion/mass effect. Vascular: No hyperdense vessel identified. Skull: No acute fracture. Sinuses/Orbits: Clear sinuses.  No acute orbital findings. Other: No mastoid effusions. IMPRESSION: No evidence of acute intracranial abnormality. Electronically Signed   By: Feliberto Harts M.D.   On: 01/05/2022 14:02   DG Chest 2 View  Result Date: 01/05/2022 CLINICAL DATA:  Shortness of breath. EXAM: CHEST - 2 VIEW COMPARISON:  Chest x-ray dated December 15, 2018. FINDINGS: The heart size and mediastinal contours are within normal limits. Normal pulmonary vascularity. Mild linear scarring/atelectasis at the lung bases. No focal consolidation, pleural effusion, or pneumothorax. No acute osseous abnormality. IMPRESSION: No active cardiopulmonary disease. Electronically Signed   By: Obie Dredge M.D.   On: 01/05/2022 13:01    Procedures Procedures  This patient was on telemetry or cardiac monitoring during their time in the ED.    Medications Ordered in ED Medications  sodium chloride 0.9 % bolus 1,000 mL (0 mLs Intravenous Stopped 01/05/22 1752)  medroxyPROGESTERone (PROVERA) tablet 10 mg (10 mg Oral Given 01/05/22 1800)    ED Course/ Medical Decision Making/ A&P Clinical Course as of 01/05/22 1816  Wed Jan 05, 2022  1709 Consulting OBGYN regarding possible medical therapy to stop bleeding with history of protein S deficiency and being on xaralto.  [GL]  [id number] I discussed case with Dr. Vergie Living with OBGYN He recommends Provera three times a day until she is [facility].  [GL]    Clinical Course User Index [GL] Claudie Leach, PA-C                           Medical Decision Making Risk Prescription drug management.    MDM  This is a 51 y.o. female who presents to the ED  with vaginal bleeding The differential  of this patient includes but is not limited to miscarriage, malignancy, polyp, fibroid, coagulopathy, etc.   Initial Impression  Well appearing.  Vitals stable with normotension, normal heart rate.  Exam unremarkable.  Initial workup obtained in triage.   I personally ordered, reviewed, and interpreted all laboratory work and imaging and agree with radiologist interpretation. Results interpreted below: CBC with hgb of 11 which is down from 12.9 taken four months ago, no leukocytosis BMP shows no major electrolyte, renal dysfunction, or severe acidosis PT 22.7 and INR 2.0 which is up from baseline CT head without acute intracranial abnormality CXR without acute abnormality EKG appears similar to prior with nonspecific t wave abnormality and NSR  Assessment/Plan:  Pelvic exam reveals moderate amount of bleeding and blood clots coming from the vaginal vault and likely the cervix.  I consulted OBGYN regarding medical therapy given that patient is symptomatic from  bleeding and has a complicated history of factor S protein deficiency and newly on anticoagulation. I spoke with Dr. Vergie Living who recommends 10 mg Provera now and continue to take this three times a day until she is followed up with OBGYN or PCP.  She will need to have postmenopausal bleeding evaluated outpatient as well. Patient still remains stable. She is stable for discharge.    Charting Requirements Additional history is obtained from:  Independent historian External Records from outside source obtained and reviewed including: Recent hematology note Social Determinants of Health:  none Pertinant PMH that complicates patient's illness: Factor S protein deficiency  Patient Care Problems that were addressed during this visit: - Vaginal Bleeding: Acute illness with systemic symptoms This patient was maintained on a cardiac monitor/telemetry. I personally viewed and interpreted the cardiac monitor which reveals an underlying  rhythm of NSR Medications given in ED: Provera, IVF Reevaluation of the patient after these medicines showed that the patient improved I have reviewed home medications and made changes accordingly.  Critical Care Interventions: n/a Consultations: OBGYN Disposition: discharge  This is a supervised visit with my attending physician, Dr. Jacqulyn Bath. We have discussed this patient and they have altered the plan as needed.  Portions of this note were generated with Scientist, clinical (histocompatibility and immunogenetics). Dictation errors may occur despite best attempts at proofreading.  Final Clinical Impression(s) / ED Diagnoses Final diagnoses:  Vaginal bleeding    Rx / DC Orders ED Discharge Orders          Ordered    medroxyPROGESTERone (PROVERA) 5 MG tablet  Every 8 hours        01/05/22 1741              Claudie Leach, PA-C 01/05/22 1816    Long, Arlyss Repress, MD 01/05/22 2249

## 2022-01-05 NOTE — Discharge Instructions (Addendum)
Please start taking Provera 10 mg three times a day to decrease your bleeding. You should take this until you are evaluated by your OBGYN or your PCP. You should schedule a follow up appointment with your PCP.I have also included a follow up with OBGYN for you. Please call to schedule an appointment.

## 2022-01-05 NOTE — ED Provider Triage Note (Addendum)
Emergency Medicine Provider Triage Evaluation Note  Natalie Vasquez , a 51 y.o. female  was evaluated in triage.  Pt complains of dizziness, weakness that has been ongoing for the past week.  Patiently was recently started on Xarelto 3 weeks ago, has continued to have vaginal bleeding soaking 4-5 thick pads in a day.  She reports feeling like "like I am drunk ". Prior hx of blood transfusion several years ago. Prior hx of CVA and TIA.   Review of Systems  Positive: Shortness of breath, vaginal bleeding Negative: Chest pain, cough, fever  Physical Exam  BP 126/87 (BP Location: Right Arm)   Pulse 75   Temp 98.7 F (37.1 C) (Oral)   Resp 15   SpO2 97%  Gen:   Awake, no distress   Resp:  Normal effort  MSK:   Moves extremities without difficulty  Other:  Slow to response, conjunctiva does not appear pale.  Lungs are clear to auscultation.  Medical Decision Making  Medically screening exam initiated at 12:43 PM.  Appropriate orders placed.  Natalie Vasquez was informed that the remainder of the evaluation will be completed by another provider, this initial triage assessment does not replace that evaluation, and the importance of remaining in the ED until their evaluation is complete.  Chart review with a previous visit from hematology oncology 12/09/2021 Dr. Pamelia Hoit who recommended patient be started on Xarelto due to history of protein S deficiency.    Claude Manges, PA-C 01/05/22 1243    Claude Manges, PA-C 01/05/22 1244

## 2022-02-08 ENCOUNTER — Ambulatory Visit: Payer: No Typology Code available for payment source | Admitting: Neurology

## 2022-02-17 ENCOUNTER — Other Ambulatory Visit: Payer: Self-pay | Admitting: *Deleted

## 2022-02-17 ENCOUNTER — Telehealth: Payer: Self-pay | Admitting: *Deleted

## 2022-02-17 DIAGNOSIS — D6859 Other primary thrombophilia: Secondary | ICD-10-CM

## 2022-02-17 NOTE — Telephone Encounter (Signed)
Patient called office, LVM, with following concerns: Began Xarelto 20 mg in July 2023 as prescribed by Dr. Lindi Adie. Since that time, she has had large amount vaginal bleeding and has passed clots about 2 inches across. She has been to the ED 3 times since July for concerns r/t bleeding, most recently yesterday. She asked if it might be related to Xarelto?  Dr. Lindi Adie informed of patient symptoms and concerns.  Contacted patient with Dr. Geralyn Flash response: Stop Xarelto now. He wants to see her in office in 2 weeks with labs appt. Patient verbalized understanding and states she will stop Xarelto. Informed her scheduler will contact her for appts. Schedule message sent

## 2022-02-21 ENCOUNTER — Telehealth: Payer: Self-pay

## 2022-02-21 NOTE — Telephone Encounter (Signed)
Pt called and LVM stating she is concerned regarding her iron panel drawn by her MD at the [place]. Pt is being treated by Dr Lindi Adie for Protein S with Xarelto starter pack as well as an asprin 81 mg prescribed from another MD for stroke prevention.  Pt reports her Iron is 33, Ferritin 5.1, iron sat 7.7%. She reports she is weak, tired and experiencing vaginal bleeding.  Attempted to return call to pt and LVM for call back.

## 2022-02-23 ENCOUNTER — Other Ambulatory Visit: Payer: Self-pay | Admitting: *Deleted

## 2022-02-23 DIAGNOSIS — D6859 Other primary thrombophilia: Secondary | ICD-10-CM

## 2022-02-23 NOTE — Progress Notes (Signed)
Received call from pt with complaint of shortness of breath with exertion, fatigue, and elevated HR of 120 while resting. Pt states PCP recently obtained iron studies which showed pt to be anemic.  Pt also states she has recent experienced increased vaginal bleeding and has stopped Xarelto on 02/17/22.  Per MD pt needing to be seen with lab work.  Appt scheduled, pt educated and verbalized understanding.

## 2022-02-24 ENCOUNTER — Other Ambulatory Visit: Payer: Self-pay

## 2022-02-24 ENCOUNTER — Encounter: Payer: Self-pay | Admitting: Adult Health

## 2022-02-24 ENCOUNTER — Inpatient Hospital Stay: Payer: No Typology Code available for payment source | Attending: Hematology and Oncology

## 2022-02-24 ENCOUNTER — Inpatient Hospital Stay (HOSPITAL_BASED_OUTPATIENT_CLINIC_OR_DEPARTMENT_OTHER): Payer: No Typology Code available for payment source | Admitting: Adult Health

## 2022-02-24 VITALS — BP 128/85 | HR 91 | Temp 97.4°F | Resp 18 | Ht 66.0 in | Wt 261.1 lb

## 2022-02-24 DIAGNOSIS — D500 Iron deficiency anemia secondary to blood loss (chronic): Secondary | ICD-10-CM

## 2022-02-24 DIAGNOSIS — D6859 Other primary thrombophilia: Secondary | ICD-10-CM | POA: Insufficient documentation

## 2022-02-24 DIAGNOSIS — N939 Abnormal uterine and vaginal bleeding, unspecified: Secondary | ICD-10-CM | POA: Insufficient documentation

## 2022-02-24 DIAGNOSIS — Z7901 Long term (current) use of anticoagulants: Secondary | ICD-10-CM | POA: Insufficient documentation

## 2022-02-24 LAB — CMP (CANCER CENTER ONLY)
ALT: 12 U/L (ref 0–44)
AST: 15 U/L (ref 15–41)
Albumin: 4.3 g/dL (ref 3.5–5.0)
Alkaline Phosphatase: 83 U/L (ref 38–126)
Anion gap: 9 (ref 5–15)
BUN: 12 mg/dL (ref 6–20)
CO2: 29 mmol/L (ref 22–32)
Calcium: 9.3 mg/dL (ref 8.9–10.3)
Chloride: 101 mmol/L (ref 98–111)
Creatinine: 0.85 mg/dL (ref 0.44–1.00)
GFR, Estimated: >60 mL/min (ref >60)
Glucose, Bld: 97 mg/dL (ref 70–99)
Potassium: 3.5 mmol/L (ref 3.5–5.1)
Sodium: 139 mmol/L (ref 135–145)
Total Bilirubin: 0.4 mg/dL (ref 0.3–1.2)
Total Protein: 8.3 g/dL — ABNORMAL HIGH (ref 6.5–8.1)

## 2022-02-24 LAB — IRON AND IRON BINDING CAPACITY (CC-WL,HP ONLY)
Iron: 18 ug/dL — ABNORMAL LOW (ref 28–170)
Saturation Ratios: 4 % — ABNORMAL LOW (ref 10.4–31.8)
TIBC: 517 ug/dL — ABNORMAL HIGH (ref 250–450)
UIBC: 499 ug/dL — ABNORMAL HIGH (ref 148–442)

## 2022-02-24 LAB — CBC WITH DIFFERENTIAL (CANCER CENTER ONLY)
Abs Immature Granulocytes: 0.02 10*3/uL (ref 0.00–0.07)
Basophils Absolute: 0 10*3/uL (ref 0.0–0.1)
Basophils Relative: 0 %
Eosinophils Absolute: 0.2 10*3/uL (ref 0.0–0.5)
Eosinophils Relative: 2 %
HCT: 30.9 % — ABNORMAL LOW (ref 36.0–46.0)
Hemoglobin: 10 g/dL — ABNORMAL LOW (ref 12.0–15.0)
Immature Granulocytes: 0 %
Lymphocytes Relative: 38 %
Lymphs Abs: 2.8 10*3/uL (ref 0.7–4.0)
MCH: 27.6 pg (ref 26.0–34.0)
MCHC: 32.4 g/dL (ref 30.0–36.0)
MCV: 85.4 fL (ref 80.0–100.0)
Monocytes Absolute: 0.4 10*3/uL (ref 0.1–1.0)
Monocytes Relative: 6 %
Neutro Abs: 4 10*3/uL (ref 1.7–7.7)
Neutrophils Relative %: 54 %
Platelet Count: 543 10*3/uL — ABNORMAL HIGH (ref 150–400)
RBC: 3.62 MIL/uL — ABNORMAL LOW (ref 3.87–5.11)
RDW: 15.4 % (ref 11.5–15.5)
WBC Count: 7.5 10*3/uL (ref 4.0–10.5)
nRBC: 0 % (ref 0.0–0.2)

## 2022-02-24 LAB — PROTIME-INR
INR: 1 (ref 0.8–1.2)
Prothrombin Time: 13.4 seconds (ref 11.4–15.2)

## 2022-02-24 LAB — FERRITIN: Ferritin: 4 ng/mL — ABNORMAL LOW (ref 11–307)

## 2022-02-24 LAB — SAMPLE TO BLOOD BANK

## 2022-02-24 NOTE — Patient Instructions (Signed)
Iron Deficiency Anemia, Adult  Iron deficiency anemia is when you do not have enough red blood cells or hemoglobin in your blood. This happens because you have too little iron in your body. Hemoglobin carries oxygen to parts of the body. Anemia can cause your body to not get enough oxygen. What are the causes? Not eating enough foods that have iron in them. The body not being able to take in iron well. Blood loss. What increases the risk? Having menstrual periods. Being pregnant. What are the signs or symptoms? Pale skin, lips, and nails. Weakness, dizziness, and getting tired easily. Feeling like you cannot breathe well when moving (shortness of breath). Cold hands and feet. Mild anemia may not cause any symptoms. How is this treated? This condition is treated by finding out why you do not have enough iron and then getting more iron. It may include: Adding foods to your diet that have a lot of iron. Taking iron pills (supplements). If you are pregnant or breastfeeding, you may need to take extra iron. Your diet often does not provide the amount of iron that you need. Getting more vitamin C in your diet. Vitamin C helps your body take in iron. You may need to take iron pills with a glass of orange juice or vitamin C pills. Medicines to make heavy menstrual periods lighter. Surgery or testing procedures to find what is causing the condition. You may need blood tests to see if treatment is working. If the treatment does not seem to be working, you may need more tests. Follow these instructions at home: Medicines Take over-the-counter and prescription medicines only as told by your doctor. This includes iron pills and vitamins. Taking them as told is important because too much iron can be harmful. Take iron pills when your stomach is empty. If you cannot handle this, take them with food. Do not drink milk or take antacids at the same time as your iron pills. Iron pills may turn your poop  (stool)black. If you cannot handle taking iron pills by mouth, ask your doctor about getting iron through: An IV tube. A shot (injection) into a muscle. Eating and drinking Talk with your doctor before changing the foods you eat. Your doctor may tell you to eat foods that have a lot of iron, such as: Liver. Low-fat (lean) beef. Breads and cereals that have iron added to them. Eggs. Dried fruit. Dark green, leafy vegetables. Eat fresh fruits and vegetables that are high in vitamin C. They help your body use iron. Foods with a lot of vitamin C include: Oranges. Peppers. Tomatoes. Mangoes. Managing constipation If you are taking iron pills, they may cause trouble pooping (constipation). To prevent or treat this, you may need to: Drink enough fluid to keep your pee (urine) pale yellow. Take over-the-counter or prescription medicines. Eat foods that are high in fiber. These include beans, whole grains, and fresh fruits and vegetables. Limit foods that are high in fat and sugar. These include fried or sweet foods. General instructions Return to your normal activities when your doctor says that it is safe. Keep all follow-up visits. Contact a doctor if: You feel like you may vomit (nauseous), or you vomit. You feel weak. You get light-headed when getting up from sitting or lying down. You are sweating for no reason. You have trouble pooping. You have worse breathing with physical activity. You have heaviness in your chest. Get help right away if: You faint. If this happens, do not drive yourself   to the hospital. You have a fast heartbeat, or a heartbeat that does not feel regular. Summary Iron deficiency anemia happens when you have too little iron in your body. This condition is treated by finding out why you do not have enough iron in your body and then getting more iron. Take over-the-counter and prescription medicines only as told by your doctor. Eat fresh fruits and vegetables  that are high in vitamin C. Contact a doctor if you have trouble pooping or feel weak. This information is not intended to replace advice given to you by your health care provider. Make sure you discuss any questions you have with your health care provider. Document Revised: 06/24/2021 Document Reviewed: 06/24/2021 Elsevier Patient Education  2023 Elsevier Inc. Ferumoxytol Injection What is this medication? FERUMOXYTOL (FER ue MOX i tol) treats low levels of iron in your body (iron deficiency anemia). Iron is a mineral that plays an important role in making red blood cells, which carry oxygen from your lungs to the rest of your body. This medicine may be used for other purposes; ask your health care provider or pharmacist if you have questions. COMMON BRAND NAME(S): Feraheme What should I tell my care team before I take this medication? They need to know if you have any of these conditions: Anemia not caused by low iron levels High levels of iron in the blood Magnetic resonance imaging (MRI) test scheduled An unusual or allergic reaction to iron, other medications, foods, dyes, or preservatives Pregnant or trying to get pregnant Breast-feeding How should I use this medication? This medication is for injection into a vein. It is given in a hospital or clinic setting. Talk to your care team the use of this medication in children. Special care may be needed. Overdosage: If you think you have taken too much of this medicine contact a poison control center or emergency room at once. NOTE: This medicine is only for you. Do not share this medicine with others. What if I miss a dose? It is important not to miss your dose. Call your care team if you are unable to keep an appointment. What may interact with this medication? Other iron products This list may not describe all possible interactions. Give your health care provider a list of all the medicines, herbs, non-prescription drugs, or dietary  supplements you use. Also tell them if you smoke, drink alcohol, or use illegal drugs. Some items may interact with your medicine. What should I watch for while using this medication? Visit your care team regularly. Tell your care team if your symptoms do not start to get better or if they get worse. You may need blood work done while you are taking this medication. You may need to follow a special diet. Talk to your care team. Foods that contain iron include: whole grains/cereals, dried fruits, beans, or peas, leafy green vegetables, and organ meats (liver, kidney). What side effects may I notice from receiving this medication? Side effects that you should report to your care team as soon as possible: Allergic reactions--skin rash, itching, hives, swelling of the face, lips, tongue, or throat Low blood pressure--dizziness, feeling faint or lightheaded, blurry vision Shortness of breath Side effects that usually do not require medical attention (report to your care team if they continue or are bothersome): Flushing Headache Joint pain Muscle pain Nausea Pain, redness, or irritation at injection site This list may not describe all possible side effects. Call your doctor for medical advice about side effects. You  may report side effects to FDA at 1-800-FDA-1088. Where should I keep my medication? This medication is given in a hospital or clinic and will not be stored at home. NOTE: This sheet is a summary. It may not cover all possible information. If you have questions about this medicine, talk to your doctor, pharmacist, or health care provider.  2023 Elsevier/Gold Standard (2020-10-09 00:00:00)
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
## 2022-02-24 NOTE — Assessment & Plan Note (Signed)
Due to Natalie Vasquez has significant vaginal bleeding she has lost iron and her ferritin level is 5.  I have recommended that she receive Feraheme 510 mg IV weekly x2.  This is covered by her insurance plan per Natalie Vasquez however we do need to ensure that we have the appropriate measures in place since she is covered by the [place].  We are reaching out to her VA plan and will be able to proceed with this next week therefore she will return weekly beginning next week for her Feraheme x2 weeks and then we will see her back in approximately 4 to 5 weeks for labs and follow-up.  In reviewing her shortness of breath, orthostatic vital signs, and ambulatory oxygen she is not developing tachycardia, tachypnea, hypoxia, or significant orthostasis therefore I do not feel strongly about ordering CTA.  It is also reassuring that a D-dimer was 0.3 which is negative last week.  I reviewed the above with Natalie Vasquez and her mom in detail and they are in agreement with the plan.

## 2022-02-24 NOTE — Assessment & Plan Note (Signed)
Natalie Vasquez was diagnosed with protein S deficiency in the setting of TIAs.  She was started on Xarelto but due to her vaginal bleeding we cannot continue this.  We stopped Xarelto September 20.  I reviewed this with Dr. Lindi Adie and his recommendation is her to continue to stay off Xarelto until we can get the vaginal bleeding under control.  I discontinued Xarelto from [name]'s medication list and reviewed those instructions with her along with provided her with an updated medication list.  She verbalized understanding.  Her homework before her next visit includes following up with primary care and gynecology to establish a plan to fix the vaginal bleeding.  We will discuss restarting Xarelto once this issue has resolved.

## 2022-02-25 ENCOUNTER — Encounter: Payer: Self-pay | Admitting: Hematology and Oncology

## 2022-02-28 ENCOUNTER — Ambulatory Visit: Payer: No Typology Code available for payment source | Admitting: Hematology and Oncology

## 2022-02-28 ENCOUNTER — Other Ambulatory Visit: Payer: No Typology Code available for payment source

## 2022-03-02 ENCOUNTER — Inpatient Hospital Stay: Payer: No Typology Code available for payment source | Attending: Hematology and Oncology

## 2022-03-02 ENCOUNTER — Inpatient Hospital Stay (HOSPITAL_BASED_OUTPATIENT_CLINIC_OR_DEPARTMENT_OTHER): Payer: No Typology Code available for payment source | Admitting: Physician Assistant

## 2022-03-02 VITALS — BP 124/75 | HR 74 | Temp 98.8°F | Resp 18

## 2022-03-02 DIAGNOSIS — D6859 Other primary thrombophilia: Secondary | ICD-10-CM

## 2022-03-02 DIAGNOSIS — D509 Iron deficiency anemia, unspecified: Secondary | ICD-10-CM | POA: Insufficient documentation

## 2022-03-02 DIAGNOSIS — R109 Unspecified abdominal pain: Secondary | ICD-10-CM | POA: Diagnosis not present

## 2022-03-02 DIAGNOSIS — D500 Iron deficiency anemia secondary to blood loss (chronic): Secondary | ICD-10-CM

## 2022-03-02 MED ORDER — SODIUM CHLORIDE 0.9 % IV SOLN
510.0000 mg | Freq: Once | INTRAVENOUS | Status: AC
  Administered 2022-03-02: 510 mg via INTRAVENOUS
  Filled 2022-03-02: qty 510

## 2022-03-02 MED ORDER — ACETAMINOPHEN 325 MG PO TABS
650.0000 mg | ORAL_TABLET | Freq: Once | ORAL | Status: AC
  Administered 2022-03-02: 650 mg via ORAL
  Filled 2022-03-02: qty 2

## 2022-03-02 MED ORDER — LORATADINE 10 MG PO TABS
10.0000 mg | ORAL_TABLET | Freq: Every day | ORAL | Status: DC
  Administered 2022-03-02: 10 mg via ORAL
  Filled 2022-03-02: qty 1

## 2022-03-02 MED ORDER — SODIUM CHLORIDE 0.9 % IV SOLN: Freq: Once | INTRAVENOUS | Status: AC

## 2022-03-02 NOTE — Patient Instructions (Signed)

## 2022-03-02 NOTE — Progress Notes (Signed)
Symptom Management Consult note [facility]    Patient Care Team: Borum, Vista Mink, MD as PCP - General (Internal Medicine)    Name of the patient: Natalie Vasquez  [id number]  08-13-1970   Date of visit: 03/02/2022   Chief Complaint/Reason for visit: abdominal pain post iron infusion   Current Therapy: Feraheme  Last treatment:  first infusion today  ASSESSMENT & PLAN: Patient is a 51 y.o. female  with pertinent history of Protein S deficiency followed by Dr. Pamelia Hoit.  I have viewed most recent oncology note and lab work.    #) Protein S deficiency - Next appointment with heme/onc is 11/17   #) Abdominal pain during IV iron infusion for IDA -Patient nontoxic-appearing, hemodynamically stable. -Abdominal exam is benign. No respiratory or neuro symptoms. -Patient was finishing her post infusion observation when she developed a dull cramping pain in her right lower abdomen.  She denies history of similar pain. -She was given Tylenol.  On reassessment patient was able to ambulate and pain has resolved. -Symptoms were mild if this was a reaction related to iron infusion. Patient is stable to discharge home. Dr. Pamelia Hoit made aware.   - Strict ED precautions discussed should symptoms worsen.      Heme/Onc History: Oncology History   No history exists.      Interval history-: Natalie Vasquez is a 51 y.o. 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  Patient was in the final minutes of her post infusion observation when she developed a dull cramping pain in her right lower abdomen.  This was her first time receiving IV Feraheme.  She admits to feeling fine during the infusion.  Her only premedication today was Claritin.Denies fever, chills, headache, nausea, numbness, tingling.      ROS  All other systems are reviewed and are negative for acute  change except as noted in the HPI.    Allergies  Allergen Reactions   Buspirone    Flunisolide    Isoniazid    Menthol    Metoclopramide Other (See Comments)    Dystonia reaction   Quetiapine    Topiramate      Past Medical History:  Diagnosis Date   Anxiety    Arthritis    Depression    Fibrosis, breast, left    GERD (gastroesophageal reflux disease)    Graves disease    Hypothyroid    Obstructive sleep apnea    uses CPAP at night   Pancreatitis    PTSD (post-traumatic stress disorder)    Urinary incontinence      Past Surgical History:  Procedure Laterality Date   BREAST REDUCTION SURGERY Bilateral 1997   CHOLECYSTECTOMY  1998   KNEE ARTHROSCOPY Left 2017   LIPOSUCTION Bilateral 1997   hips and thighs    Social History   Socioeconomic History   Marital status: Single    Spouse name: Not on file   Number of children: Not on file   Years of education: Not on file   Highest education level: Not on file  Occupational History   Not on file  Tobacco Use   Smoking status: Never   Smokeless tobacco: Never  Vaping Use   Vaping Use: Never used  Substance and Sexual Activity   Alcohol use: Never   Drug use: Never   Sexual activity: Not Currently  Other Topics Concern  Not on file  Social History Narrative   Not on file   Social Determinants of Health   Financial Resource Strain: Not on file  Food Insecurity: Not on file  Transportation Needs: Not on file  Physical Activity: Not on file  Stress: Not on file  Social Connections: Not on file  Intimate Partner Violence: Not on file    No family history on file.   Current Outpatient Medications:    aspirin 81 MG chewable tablet, Chew 1 tablet (81 mg total) by mouth daily., Disp: 30 tablet, Rfl: 0   DULoxetine (CYMBALTA) 60 MG capsule, Take 120 mg by mouth at bedtime., Disp: , Rfl:    levothyroxine (SYNTHROID) 100 MCG tablet, Take 1 tablet by mouth daily. Take along with 75 mcg tablet=175 mcg,  Disp: , Rfl:    lidocaine (LIDODERM) 5 %, Place 1 patch onto the skin daily as needed (pain)., Disp: , Rfl:    medroxyPROGESTERone (PROVERA) 5 MG tablet, Take 2 tablets (10 mg total) by mouth every 8 (eight) hours for 14 days., Disp: 84 tablet, Rfl: 0   meloxicam (MOBIC) 15 MG tablet, Take 15 mg by mouth 2 (two) times daily., Disp: , Rfl:    oxybutynin (DITROPAN-XL) 10 MG 24 hr tablet, Take 5 mg by mouth 2 (two) times daily., Disp: , Rfl:    pantoprazole (PROTONIX) 40 MG tablet, Take 40 mg by mouth at bedtime., Disp: , Rfl:    REFRESH 1.4-0.6 % SOLN, Apply 1 drop to eye daily as needed (dry eyes)., Disp: , Rfl:    traZODone (DESYREL) 100 MG tablet, Take 100 mg by mouth at bedtime., Disp: , Rfl:   PHYSICAL EXAM: ECOG FS:1 - Symptomatic but completely ambulatory  T: 98.8    BP: 124/75    HR: 74   Resp: 18   O2: 100% RA Physical Exam Vitals and nursing note reviewed.  Constitutional:      Appearance: She is well-developed. She is not ill-appearing or toxic-appearing.  HENT:     Head: Normocephalic.     Nose: Nose normal.  Eyes:     Conjunctiva/sclera: Conjunctivae normal.  Neck:     Vascular: No JVD.  Cardiovascular:     Rate and Rhythm: Normal rate and regular rhythm.     Pulses: Normal pulses.     Heart sounds: Normal heart sounds.  Pulmonary:     Effort: Pulmonary effort is normal.     Breath sounds: Normal breath sounds.  Abdominal:     General: There is no distension.     Palpations: Abdomen is soft. There is no mass.     Tenderness: There is no abdominal tenderness. There is no right CVA tenderness, left CVA tenderness, guarding or rebound.     Hernia: No hernia is present.  Musculoskeletal:        General: Normal range of motion.     Cervical back: Normal range of motion.     Right lower leg: No edema.     Left lower leg: No edema.  Skin:    General: Skin is warm and dry.  Neurological:     Mental Status: She is oriented to person, place, and time.         LABORATORY DATA: I have reviewed the data as listed    Latest Ref Rng & Units 02/24/2022    7:54 AM 01/05/2022   12:13 PM 08/12/2021    4:34 PM  CBC  WBC 4.0 - 10.5 K/uL 7.5  6.4  8.3   Hemoglobin 12.0 - 15.0 g/dL 10.0  11.0  12.9   Hematocrit 36.0 - 46.0 % 30.9  34.5  41.4   Platelets 150 - 400 K/uL 543  378  348         Latest Ref Rng & Units 02/24/2022    7:54 AM 01/05/2022   12:13 PM 08/12/2021    4:34 PM  CMP  Glucose 70 - 99 mg/dL 97  88  87   BUN 6 - 20 mg/dL 12  8  18    Creatinine 0.44 - 1.00 mg/dL 0.85  0.93  0.90   Sodium 135 - 145 mmol/L 139  137  139   Potassium 3.5 - 5.1 mmol/L 3.5  3.9  3.8   Chloride 98 - 111 mmol/L 101  106  108   CO2 22 - 32 mmol/L 29  21  22    Calcium 8.9 - 10.3 mg/dL 9.3  9.2  9.5   Total Protein 6.5 - 8.1 g/dL 8.3   8.2   Total Bilirubin 0.3 - 1.2 mg/dL 0.4   0.4   Alkaline Phos 38 - 126 U/L 83   82   AST 15 - 41 U/L 15   26   ALT 0 - 44 U/L 12   26        RADIOGRAPHIC STUDIES (from last 24 hours if applicable) I have personally reviewed the radiological images as listed and agreed with the findings in the report. No results found.      Visit Diagnosis: 1. Abdominal pain, unspecified abdominal location   2. Protein S deficiency ([place])      No orders of the defined types were placed in this encounter.   All questions were answered. The patient knows to call the clinic with any problems, questions or concerns. No barriers to learning was detected.  I have spent a total of 20 minutes minutes of face-to-face and non-face-to-face time, preparing to see the patient, obtaining and/or reviewing separately obtained history, performing a medically appropriate examination, counseling and educating the patient, ordering tests, documenting clinical information in the electronic health record, and care coordination (communications with other health care professionals or caregivers).    Thank you for allowing me to participate in the  care of this patient.    Barrie Folk, PA-C Department of Hematology/Oncology [facility] at [facility] Phone: [phone]  [phone]    03/02/2022 12:47 PM

## 2022-03-02 NOTE — Progress Notes (Signed)
After 30 minute post-observation period, pt began complaining of slight dizziness and slight side pain. Kaitlyn PA came to bedside to assess. 650mg  Tylenol given. Pt reported resolution of symptoms. Discharged with no complaints.

## 2022-03-09 ENCOUNTER — Inpatient Hospital Stay: Payer: No Typology Code available for payment source

## 2022-03-09 ENCOUNTER — Other Ambulatory Visit: Payer: Self-pay

## 2022-03-09 VITALS — BP 128/78 | HR 78 | Temp 98.2°F | Resp 18

## 2022-03-09 DIAGNOSIS — D500 Iron deficiency anemia secondary to blood loss (chronic): Secondary | ICD-10-CM

## 2022-03-09 DIAGNOSIS — D6859 Other primary thrombophilia: Secondary | ICD-10-CM | POA: Diagnosis not present

## 2022-03-09 MED ORDER — SODIUM CHLORIDE 0.9 % IV SOLN
510.0000 mg | Freq: Once | INTRAVENOUS | Status: AC
  Administered 2022-03-09: 510 mg via INTRAVENOUS
  Filled 2022-03-09: qty 510

## 2022-03-09 MED ORDER — SODIUM CHLORIDE 0.9 % IV SOLN: Freq: Once | INTRAVENOUS | Status: AC

## 2022-03-09 NOTE — Progress Notes (Signed)
Patient waited 30 minute post observation for iron. No issues vss

## 2022-03-09 NOTE — Patient Instructions (Signed)

## 2022-03-11 ENCOUNTER — Other Ambulatory Visit: Payer: No Typology Code available for payment source

## 2022-03-11 ENCOUNTER — Ambulatory Visit: Payer: No Typology Code available for payment source | Admitting: Hematology and Oncology

## 2022-04-10 NOTE — Progress Notes (Signed)
HEMATOLOGY-ONCOLOGY WEBEX VISIT PROGRESS NOTE  I connected with Natalie Vasquez on 04/15/2022 at  9:45 AM EST by Mychart video conference and verified that I am speaking with the correct person using two identifiers.  I discussed the limitations, risks, security and privacy concerns of performing an evaluation and management service by Webex and the availability of in person appointments.  I also discussed with the patient that there may be a patient responsible charge related to this service. The patient expressed understanding and agreed to proceed.  Patient's Location: Home Physician Location: Clinic  CHIEF COMPLIANT: Transient cerebral attacks with protein S deficiency and iron deficiency anemia  INTERVAL HISTORY: Natalie Vasquez is a 51 y.o. female with above-mentioned history of  recent diagnosis of transient cerebral attacks. She presents to the clinic for a MyChart follow-up.  She was on blood thinners with Xarelto for the protein S deficiency and TIAs.  She developed heavy vaginal bleeding which made her severely anemic and iron deficient.  She received Feraheme and is here today a month after that to discuss labs and follow-up.  She felt remarkably well after receiving iron infusion and stopping the Xarelto.  She has no longer has any vaginal bleeding at this time.    REVIEW OF SYSTEMS:   Constitutional: Denies fevers, chills or abnormal weight loss   All other systems were reviewed with the patient and are negative.  Observations/Objective:  There were no vitals filed for this visit. There is no height or weight on file to calculate BMI.  I have reviewed the data as listed    Latest Ref Rng & Units 04/13/2022    7:37 AM 02/24/2022    7:54 AM 01/05/2022   12:13 PM  CMP  Glucose 70 - 99 mg/dL 95  97  88   BUN 6 - 20 mg/dL 11  12  8    Creatinine 0.44 - 1.00 mg/dL  7.56  4.33   Sodium 135 - 145 mmol/L 138  139  137   Potassium 3.5 - 5.1 mmol/L 3.7  3.5  3.9    Chloride 98 - 111 mmol/L 104  101  106   CO2 22 - 32 mmol/L 28  29  21    Calcium 8.9 - 10.3 mg/dL 9.2  9.3  9.2   Total Protein 6.5 - 8.1 g/dL 7.7  8.3    Total Bilirubin 0.3 - 1.2 mg/dL 0.5  0.4    Alkaline Phos 38 - 126 U/L 80  83    AST 15 - 41 U/L 89  15    ALT 0 - 44 U/L 61  12      Lab Results  Component Value Date   WBC 5.0 04/13/2022   HGB 13.4 04/13/2022   HCT 40.9 04/13/2022   MCV 90.9 04/13/2022   PLT 395 04/13/2022   NEUTROABS 2.4 04/13/2022      Assessment Plan:  Protein S deficiency (HCC) Protein S deficiency: With history of recurrent TIAs Treatment plan: Lifelong anticoagulation with Xarelto along with aspirin Xarelto toxicities: Severe vaginal bleeding led to holding Xarelto.     Iron deficiency anemia due to chronic blood loss Significant vaginal bleeding she has lost iron and her ferritin level is 5.    Prior iron: October 2023: Feraheme   Remarkable improvement in the blood work. No further bleedings therefore she will resume Xarelto. If she has any recurrence of the bleeding then she will stop Xarelto permanently.  Return to clinic in 4 months  with labs and follow-up 2 days later with a MyChart visit.   I discussed the assessment and treatment plan with the patient. The patient was provided an opportunity to ask questions and all were answered. The patient agreed with the plan and demonstrated an understanding of the instructions. The patient was advised to call back or seek an in-person evaluation if the symptoms worsen or if the condition fails to improve as anticipated.   I provided 12 minutes of face-to-face Web Ex time during this encounter.    Sabas Sous, MD 04/15/2022 I Janan Ridge am scribing for Dr. Pamelia Hoit  I have reviewed the above documentation for accuracy and completeness, and I agree with the above.

## 2022-04-13 ENCOUNTER — Inpatient Hospital Stay: Payer: No Typology Code available for payment source | Attending: Hematology and Oncology

## 2022-04-13 DIAGNOSIS — D509 Iron deficiency anemia, unspecified: Secondary | ICD-10-CM | POA: Diagnosis not present

## 2022-04-13 DIAGNOSIS — Z7901 Long term (current) use of anticoagulants: Secondary | ICD-10-CM | POA: Insufficient documentation

## 2022-04-13 DIAGNOSIS — D6859 Other primary thrombophilia: Secondary | ICD-10-CM | POA: Diagnosis present

## 2022-04-13 LAB — CBC WITH DIFFERENTIAL (CANCER CENTER ONLY)
Abs Immature Granulocytes: 0.02 10*3/uL (ref 0.00–0.07)
Basophils Absolute: 0 10*3/uL (ref 0.0–0.1)
Basophils Relative: 0 %
Eosinophils Absolute: 0.1 10*3/uL (ref 0.0–0.5)
Eosinophils Relative: 2 %
HCT: 40.9 % (ref 36.0–46.0)
Hemoglobin: 13.4 g/dL (ref 12.0–15.0)
Immature Granulocytes: 0 %
Lymphocytes Relative: 41 %
Lymphs Abs: 2 10*3/uL (ref 0.7–4.0)
MCH: 29.8 pg (ref 26.0–34.0)
MCHC: 32.8 g/dL (ref 30.0–36.0)
MCV: 90.9 fL (ref 80.0–100.0)
Monocytes Absolute: 0.4 10*3/uL (ref 0.1–1.0)
Monocytes Relative: 8 %
Neutro Abs: 2.4 10*3/uL (ref 1.7–7.7)
Neutrophils Relative %: 49 %
Platelet Count: 395 10*3/uL (ref 150–400)
RBC: 4.5 MIL/uL (ref 3.87–5.11)
RDW: 19.4 % — ABNORMAL HIGH (ref 11.5–15.5)
WBC Count: 5 10*3/uL (ref 4.0–10.5)
nRBC: 0 % (ref 0.0–0.2)

## 2022-04-13 LAB — CMP (CANCER CENTER ONLY)
ALT: 61 U/L — ABNORMAL HIGH (ref 0–44)
AST: 89 U/L — ABNORMAL HIGH (ref 15–41)
Albumin: 3.8 g/dL (ref 3.5–5.0)
Alkaline Phosphatase: 80 U/L (ref 38–126)
Anion gap: 6 (ref 5–15)
BUN: 11 mg/dL (ref 6–20)
CO2: 28 mmol/L (ref 22–32)
Calcium: 9.2 mg/dL (ref 8.9–10.3)
Chloride: 104 mmol/L (ref 98–111)
Creatinine: 1.06 mg/dL — ABNORMAL HIGH (ref 0.44–1.00)
GFR, Estimated: >60 mL/min (ref >60)
Glucose, Bld: 95 mg/dL (ref 70–99)
Potassium: 3.7 mmol/L (ref 3.5–5.1)
Sodium: 138 mmol/L (ref 135–145)
Total Bilirubin: 0.5 mg/dL (ref 0.3–1.2)
Total Protein: 7.7 g/dL (ref 6.5–8.1)

## 2022-04-15 ENCOUNTER — Inpatient Hospital Stay (HOSPITAL_BASED_OUTPATIENT_CLINIC_OR_DEPARTMENT_OTHER): Payer: No Typology Code available for payment source | Admitting: Hematology and Oncology

## 2022-04-15 DIAGNOSIS — D500 Iron deficiency anemia secondary to blood loss (chronic): Secondary | ICD-10-CM | POA: Diagnosis not present

## 2022-04-15 DIAGNOSIS — D6859 Other primary thrombophilia: Secondary | ICD-10-CM

## 2022-04-15 NOTE — Assessment & Plan Note (Signed)
Significant vaginal bleeding she has lost iron and her ferritin level is 5.    Prior iron: October 2023: Feraheme   Remarkable improvement in the blood work. No further bleedings therefore she will resume Xarelto. If she has any recurrence of the bleeding then she will stop Xarelto permanently.

## 2022-04-15 NOTE — Assessment & Plan Note (Signed)
Protein S deficiency: With history of recurrent TIAs Treatment plan: Lifelong anticoagulation with Xarelto along with aspirin Xarelto toxicities: None  Return to clinic on as-needed basis.

## 2022-04-19 ENCOUNTER — Telehealth: Payer: Self-pay | Admitting: Hematology and Oncology

## 2022-04-19 NOTE — Telephone Encounter (Signed)
Scheduled appointment per 11/17 los. Left voicemail. 

## 2022-05-31 ENCOUNTER — Emergency Department (HOSPITAL_COMMUNITY): Payer: No Typology Code available for payment source

## 2022-05-31 ENCOUNTER — Emergency Department (HOSPITAL_COMMUNITY)
Admission: EM | Admit: 2022-05-31 | Discharge: 2022-05-31 | Discharge status: Against Medical Advice | Payer: No Typology Code available for payment source | Attending: Emergency Medicine | Admitting: Emergency Medicine

## 2022-05-31 DIAGNOSIS — R069 Unspecified abnormalities of breathing: Secondary | ICD-10-CM | POA: Diagnosis not present

## 2022-05-31 DIAGNOSIS — Z5321 Procedure and treatment not carried out due to patient leaving prior to being seen by health care provider: Secondary | ICD-10-CM | POA: Diagnosis not present

## 2022-05-31 DIAGNOSIS — R110 Nausea: Secondary | ICD-10-CM | POA: Insufficient documentation

## 2022-05-31 DIAGNOSIS — R1011 Right upper quadrant pain: Secondary | ICD-10-CM | POA: Diagnosis not present

## 2022-05-31 DIAGNOSIS — R1012 Left upper quadrant pain: Secondary | ICD-10-CM | POA: Diagnosis not present

## 2022-05-31 DIAGNOSIS — R1013 Epigastric pain: Secondary | ICD-10-CM | POA: Insufficient documentation

## 2022-05-31 LAB — CBC
HCT: 47.8 % — ABNORMAL HIGH (ref 36.0–46.0)
Hemoglobin: 14.8 g/dL (ref 12.0–15.0)
MCH: 28.8 pg (ref 26.0–34.0)
MCHC: 31 g/dL (ref 30.0–36.0)
MCV: 93.2 fL (ref 80.0–100.0)
Platelets: 348 10*3/uL (ref 150–400)
RBC: 5.13 MIL/uL — ABNORMAL HIGH (ref 3.87–5.11)
RDW: 17.6 % — ABNORMAL HIGH (ref 11.5–15.5)
WBC: 5.4 10*3/uL (ref 4.0–10.5)
nRBC: 0 % (ref 0.0–0.2)

## 2022-05-31 LAB — I-STAT BETA HCG BLOOD, ED (MC, WL, AP ONLY): I-stat hCG, quantitative: <5 m[IU]/mL (ref <5)

## 2022-05-31 LAB — COMPREHENSIVE METABOLIC PANEL
ALT: 33 U/L (ref 0–44)
AST: 28 U/L (ref 15–41)
Albumin: 3.8 g/dL (ref 3.5–5.0)
Alkaline Phosphatase: 77 U/L (ref 38–126)
Anion gap: 11 (ref 5–15)
BUN: 9 mg/dL (ref 6–20)
CO2: 24 mmol/L (ref 22–32)
Calcium: 9.3 mg/dL (ref 8.9–10.3)
Chloride: 102 mmol/L (ref 98–111)
Creatinine, Ser: 0.93 mg/dL (ref 0.44–1.00)
GFR, Estimated: >60 mL/min (ref >60)
Glucose, Bld: 92 mg/dL (ref 70–99)
Potassium: 4 mmol/L (ref 3.5–5.1)
Sodium: 137 mmol/L (ref 135–145)
Total Bilirubin: 0.5 mg/dL (ref 0.3–1.2)
Total Protein: 7.7 g/dL (ref 6.5–8.1)

## 2022-05-31 LAB — URINALYSIS, ROUTINE W REFLEX MICROSCOPIC
Bilirubin Urine: NEGATIVE
Glucose, UA: NEGATIVE mg/dL
Ketones, ur: NEGATIVE mg/dL
Nitrite: NEGATIVE
Protein, ur: 30 mg/dL — AB
Specific Gravity, Urine: 1.026 (ref 1.005–1.030)
pH: 5 (ref 5.0–8.0)

## 2022-05-31 LAB — TROPONIN I (HIGH SENSITIVITY): Troponin I (High Sensitivity): <2 ng/L (ref <18)

## 2022-05-31 LAB — LIPASE, BLOOD: Lipase: 56 U/L — ABNORMAL HIGH (ref 11–51)

## 2022-05-31 MED ORDER — KETOROLAC TROMETHAMINE 30 MG/ML IJ SOLN
30.0000 mg | Freq: Once | INTRAMUSCULAR | Status: AC
  Administered 2022-05-31: 30 mg via INTRAMUSCULAR
  Filled 2022-05-31: qty 1

## 2022-05-31 NOTE — ED Provider Triage Note (Signed)
Emergency Medicine Provider Triage Evaluation Note  Natalie Vasquez , a 52 y.o. female  was evaluated in triage.  Pt complains of abdominal pain that began this morning.  She reports a history of pancreatitis as well as cholecystitis but has previously gallbladder removed.  No prior history of abnormal pancreatic function or pancreatic cancer.  Reports some trouble breathing due to pain on inspiration as well as mild nausea.  Denies vomiting, diarrhea, chest pain, numbness, headache.  Review of Systems  Positive: As above Negative: As above  Physical Exam  BP 135/82 (BP Location: Left Arm)   Pulse 74   Temp 98.4 F (36.9 C)   Resp 16   SpO2 97%  Gen:   Awake, no distress  Resp:  Normal effort, clear to auscultation bilaterally MSK:   Moves extremities without difficulty Other:  Tenderness in right upper quadrant/epigastric/left upper quadrant.  Normal bowel sounds  Medical Decision Making  Medically screening exam initiated at 4:27 PM.  Appropriate orders placed.  Brittie Whisnant was informed that the remainder of the evaluation will be completed by another provider, this initial triage assessment does not replace that evaluation, and the importance of remaining in the ED until their evaluation is complete.     Luvenia Heller, PA-C 05/31/22 1637

## 2022-05-31 NOTE — ED Notes (Signed)
Says she is going to leave prior to room placement. [facility] tomorrow.   Alert and oriented. Questions answered. Risks reviewed. Encouraged to return with concerning sx.

## 2022-05-31 NOTE — ED Triage Notes (Signed)
Patient with history of pancreatitis complains of sharp, dull, burning pain from the center of her chest radiating to the left under her breast. Patient is alert, oriented, speaking in complete sentences, and in no apparent distress at this time.

## 2022-08-15 ENCOUNTER — Other Ambulatory Visit: Payer: No Typology Code available for payment source

## 2022-08-16 ENCOUNTER — Inpatient Hospital Stay: Payer: No Typology Code available for payment source | Attending: Nurse Practitioner

## 2022-08-16 DIAGNOSIS — D649 Anemia, unspecified: Secondary | ICD-10-CM | POA: Insufficient documentation

## 2022-08-16 DIAGNOSIS — D500 Iron deficiency anemia secondary to blood loss (chronic): Secondary | ICD-10-CM

## 2022-08-16 LAB — CBC WITH DIFFERENTIAL (CANCER CENTER ONLY)
Abs Immature Granulocytes: 0.02 10*3/uL (ref 0.00–0.07)
Basophils Absolute: 0 10*3/uL (ref 0.0–0.1)
Basophils Relative: 1 %
Eosinophils Absolute: 0.1 10*3/uL (ref 0.0–0.5)
Eosinophils Relative: 2 %
HCT: 38.3 % (ref 36.0–46.0)
Hemoglobin: 12.7 g/dL (ref 12.0–15.0)
Immature Granulocytes: 0 %
Lymphocytes Relative: 47 %
Lymphs Abs: 3 10*3/uL (ref 0.7–4.0)
MCH: 30.2 pg (ref 26.0–34.0)
MCHC: 33.2 g/dL (ref 30.0–36.0)
MCV: 91.2 fL (ref 80.0–100.0)
Monocytes Absolute: 0.5 10*3/uL (ref 0.1–1.0)
Monocytes Relative: 7 %
Neutro Abs: 2.8 10*3/uL (ref 1.7–7.7)
Neutrophils Relative %: 43 %
Platelet Count: 302 10*3/uL (ref 150–400)
RBC: 4.2 MIL/uL (ref 3.87–5.11)
RDW: 15.1 % (ref 11.5–15.5)
WBC Count: 6.5 10*3/uL (ref 4.0–10.5)
nRBC: 0 % (ref 0.0–0.2)

## 2022-08-16 LAB — FERRITIN: Ferritin: 38 ng/mL (ref 11–307)

## 2022-08-16 LAB — IRON AND IRON BINDING CAPACITY (CC-WL,HP ONLY)
Iron: 65 ug/dL (ref 28–170)
Saturation Ratios: 19 % (ref 10.4–31.8)
TIBC: 351 ug/dL (ref 250–450)
UIBC: 286 ug/dL (ref 148–442)

## 2022-08-17 ENCOUNTER — Telehealth: Payer: No Typology Code available for payment source | Admitting: Hematology and Oncology

## 2022-08-18 ENCOUNTER — Inpatient Hospital Stay (HOSPITAL_BASED_OUTPATIENT_CLINIC_OR_DEPARTMENT_OTHER): Payer: No Typology Code available for payment source | Admitting: Hematology and Oncology

## 2022-08-18 DIAGNOSIS — D500 Iron deficiency anemia secondary to blood loss (chronic): Secondary | ICD-10-CM | POA: Diagnosis not present

## 2022-08-18 NOTE — Progress Notes (Signed)
HEMATOLOGY-ONCOLOGY [place] VISIT PROGRESS NOTE  I connected with Natalie Vasquez on 08/18/2022 at  3:30 PM EDT by Mychart video conference and verified that I am speaking with the correct person using two identifiers.  I discussed the limitations, risks, security and privacy concerns of performing an evaluation and management service by Webex and the availability of in person appointments.  I also discussed with the patient that there may be a patient responsible charge related to this service. The patient expressed understanding and agreed to proceed.  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 female with above-mentioned history of  Transient cerebral attacks with protein S deficiency. She presents to the clinic for a MyChart follow-up to discuss labs.  She is complaining of worsening fatigue over the past 1 to 2 weeks.  However she was not found to be iron deficient.  She does not have any further issues with vaginal bleeding since she stopped Eliquis.    REVIEW OF SYSTEMS:   Constitutional: Denies fevers, chills or abnormal weight loss   All other systems were reviewed with the patient and are negative.  Observations/Objective:  There were no vitals filed for this visit. There is no height or weight on file to calculate BMI.  I have reviewed the data as listed    Latest Ref Rng & Units 05/31/2022    4:29 PM 04/13/2022    7:37 AM 02/24/2022    7:54 AM  CMP  Glucose 70 - 99 mg/dL 92  95  97   BUN 6 - 20 mg/dL 9  11  12    Creatinine 0.44 - 1.00 mg/dL 0.93  1.06  0.85   Sodium 135 - 145 mmol/L 137  138  139   Potassium 3.5 - 5.1 mmol/L 4.0  3.7  3.5   Chloride 98 - 111 mmol/L 102  104  101   CO2 22 - 32 mmol/L 24  28  29    Calcium 8.9 - 10.3 mg/dL 9.3  9.2  9.3   Total Protein 6.5 - 8.1 g/dL 7.7  7.7  8.3   Total Bilirubin 0.3 -  1.2 mg/dL 0.5  0.5  0.4   Alkaline Phos 38 - 126 U/L 77  80  83   AST 15 - 41 U/L 28  89  15   ALT 0 - 44 U/L 33  61  12     Lab Results  Component Value Date   WBC 6.5 08/16/2022   HGB 12.7 08/16/2022   HCT 38.3 08/16/2022   MCV 91.2 08/16/2022   PLT 302 08/16/2022   NEUTROABS 2.8 08/16/2022      Assessment Plan:  Iron deficiency anemia due to chronic blood loss Significant vaginal bleeding she has lost iron and her ferritin level is 5.  (improved after stopping Eliqis)   Prior iron: October 2023: Feraheme    Lab review: 08/16/2022: Ferritin 38 TIBC 351, iron saturation 19%, hemoglobin 12.7 Based on the above lab results patient does not need IV iron at this time.  Continued fatigue: Patient will discuss with primary care physician about checking her thyroid and electrolyte imbalances.  Recheck labs in 3 months and telephone visit after that to discuss results   I discussed the assessment and treatment plan with the patient. The patient was provided an opportunity to ask questions and all were answered. The patient agreed with the  plan and demonstrated an understanding of the instructions. The patient was advised to call back or seek an in-person evaluation if the symptoms worsen or if the condition fails to improve as anticipated.   I provided 12 minutes of face-to-face Web Ex time during this encounter.    Rulon Eisenmenger, MD 08/18/2022 I Gardiner Coins am acting as a Education administrator for [employer]  I have reviewed the above documentation for accuracy and completeness, and I agree with the above.

## 2022-08-18 NOTE — Assessment & Plan Note (Signed)
Significant vaginal bleeding she has lost iron and her ferritin level is 5.     Prior iron: October 2023: Feraheme   Lab review: 08/16/2022: Ferritin 38 TIBC 351, iron saturation 19%, hemoglobin 12.7 Based on the above lab results patient does not need IV iron at this time.  Recheck labs in 3 months and telephone visit after that to discuss results

## 2022-12-12 ENCOUNTER — Ambulatory Visit: Payer: No Typology Code available for payment source | Admitting: Hematology and Oncology

## 2022-12-13 ENCOUNTER — Inpatient Hospital Stay: Payer: No Typology Code available for payment source | Admitting: Hematology and Oncology

## 2022-12-13 ENCOUNTER — Other Ambulatory Visit: Payer: Self-pay

## 2022-12-13 ENCOUNTER — Inpatient Hospital Stay: Payer: No Typology Code available for payment source | Attending: Hematology and Oncology

## 2022-12-13 DIAGNOSIS — G459 Transient cerebral ischemic attack, unspecified: Secondary | ICD-10-CM | POA: Diagnosis present

## 2022-12-13 DIAGNOSIS — N939 Abnormal uterine and vaginal bleeding, unspecified: Secondary | ICD-10-CM | POA: Diagnosis not present

## 2022-12-13 DIAGNOSIS — D500 Iron deficiency anemia secondary to blood loss (chronic): Secondary | ICD-10-CM | POA: Insufficient documentation

## 2022-12-13 DIAGNOSIS — E039 Hypothyroidism, unspecified: Secondary | ICD-10-CM | POA: Insufficient documentation

## 2022-12-13 LAB — CBC WITH DIFFERENTIAL (CANCER CENTER ONLY)
Abs Immature Granulocytes: 0.01 10*3/uL (ref 0.00–0.07)
Basophils Absolute: 0 10*3/uL (ref 0.0–0.1)
Basophils Relative: 0 %
Eosinophils Absolute: 0.2 10*3/uL (ref 0.0–0.5)
Eosinophils Relative: 2 %
HCT: 43.8 % (ref 36.0–46.0)
Hemoglobin: 14.7 g/dL (ref 12.0–15.0)
Immature Granulocytes: 0 %
Lymphocytes Relative: 52 %
Lymphs Abs: 3.4 10*3/uL (ref 0.7–4.0)
MCH: 30.4 pg (ref 26.0–34.0)
MCHC: 33.6 g/dL (ref 30.0–36.0)
MCV: 90.7 fL (ref 80.0–100.0)
Monocytes Absolute: 0.4 10*3/uL (ref 0.1–1.0)
Monocytes Relative: 7 %
Neutro Abs: 2.6 10*3/uL (ref 1.7–7.7)
Neutrophils Relative %: 39 %
Platelet Count: 334 10*3/uL (ref 150–400)
RBC: 4.83 MIL/uL (ref 3.87–5.11)
RDW: 14.8 % (ref 11.5–15.5)
WBC Count: 6.6 10*3/uL (ref 4.0–10.5)
nRBC: 0 % (ref 0.0–0.2)

## 2022-12-13 LAB — IRON AND IRON BINDING CAPACITY (CC-WL,HP ONLY)
Iron: 84 ug/dL (ref 28–170)
Saturation Ratios: 23 % (ref 10.4–31.8)
TIBC: 358 ug/dL (ref 250–450)
UIBC: 274 ug/dL (ref 148–442)

## 2022-12-13 LAB — FERRITIN: Ferritin: 41 ng/mL (ref 11–307)

## 2022-12-13 NOTE — Progress Notes (Signed)
HEMATOLOGY-ONCOLOGY TELEPHONE VISIT PROGRESS NOTE  I connected with our patient on 12/15/22 at  2:15 PM EDT by telephone and verified that I am speaking with the correct person using two identifiers.  I discussed the limitations, risks, security and privacy concerns of performing an evaluation and management service by telephone and the availability of in person appointments.  I also discussed with the patient that there may be a patient responsible charge related to this service. The patient expressed understanding and agreed to proceed.   History of Present Illness: Natalie Vasquez 52 y.o. female is here because of recent diagnosis of transient cerebral attacks. She presents to the clinic for a telephone follow-up.  She had blood work recently and is here to review the lab results.  She reports that the fatigue that she was experiencing previously was related to hypothyroidism and it has gotten much better.    REVIEW OF SYSTEMS:   Constitutional: Denies fevers, chills or abnormal weight loss, complains of fatigue All other systems were reviewed with the patient and are negative.  Observations/Objective:     Assessment Plan:  Iron deficiency anemia due to chronic blood loss Significant vaginal bleeding she has lost iron and her ferritin level is 5.  (improved after stopping Eliqis)   Prior iron: October 2023: Feraheme     Lab review: 08/16/2022: Ferritin 38 TIBC 351, iron saturation 19%, hemoglobin 12.7 12/13/2022: Hemoglobin 14.7, MCV 90.7, iron saturation 23%, ferritin 41 Based on the above lab results patient does not need IV iron at this time.  I discussed with the patient that she is holding up with her iron levels and CBC extremely well and therefore there is no role of IV iron.   Continued fatigue: Patient was diagnosed with hypothyroidism and this explains her fatigue.  It has gotten significantly better with adjustment of her thyroid medication  Return to clinic on an  as-needed basis.    I discussed the assessment and treatment plan with the patient. The patient was provided an opportunity to ask questions and all were answered. The patient agreed with the plan and demonstrated an understanding of the instructions. The patient was advised to call back or seek an in-person evaluation if the symptoms worsen or if the condition fails to improve as anticipated.   I provided 12 minutes of non-face-to-face time during this encounter.  This includes time for charting and coordination of care   Tamsen Meek, MD  I Janan Ridge am acting as a scribe for Dr.Vanity Larsson  I have reviewed the above documentation for accuracy and completeness, and I agree with the above.

## 2022-12-13 NOTE — Assessment & Plan Note (Deleted)
Significant vaginal bleeding she has lost iron and her ferritin level is 5.  (improved after stopping Eliqis)   Prior iron: October 2023: Feraheme     Lab review: 08/16/2022: Ferritin 38 TIBC 351, iron saturation 19%, hemoglobin 12.7 12/13/2022:

## 2022-12-15 ENCOUNTER — Inpatient Hospital Stay (HOSPITAL_BASED_OUTPATIENT_CLINIC_OR_DEPARTMENT_OTHER): Payer: No Typology Code available for payment source | Admitting: Hematology and Oncology

## 2022-12-15 DIAGNOSIS — D500 Iron deficiency anemia secondary to blood loss (chronic): Secondary | ICD-10-CM | POA: Diagnosis not present

## 2022-12-15 NOTE — Assessment & Plan Note (Signed)
Significant vaginal bleeding she has lost iron and her ferritin level is 5.  (improved after stopping Eliqis)   Prior iron: October 2023: Feraheme     Lab review: 08/16/2022: Ferritin 38 TIBC 351, iron saturation 19%, hemoglobin 12.7 12/13/2022: Hemoglobin 14.7, MCV 90.7, iron saturation 23%, ferritin 41 Based on the above lab results patient does not need IV iron at this time.  I discussed with the patient that she is holding up with her iron levels and CBC extremely well and therefore there is no role of IV iron.   Continued fatigue: Patient will discuss with primary care physician   Return to clinic on an as-needed basis.

## 2022-12-16 ENCOUNTER — Telehealth: Payer: No Typology Code available for payment source | Admitting: Hematology and Oncology

## 2023-02-22 ENCOUNTER — Encounter: Payer: Self-pay | Admitting: Adult Health

## 2023-05-01 ENCOUNTER — Emergency Department (HOSPITAL_COMMUNITY): Payer: No Typology Code available for payment source

## 2023-05-01 ENCOUNTER — Emergency Department (HOSPITAL_COMMUNITY)
Admission: EM | Admit: 2023-05-01 | Discharge: 2023-05-01 | Disposition: A | Discharge status: Home / Self Care | Payer: No Typology Code available for payment source | Attending: Emergency Medicine | Admitting: Emergency Medicine

## 2023-05-01 ENCOUNTER — Other Ambulatory Visit: Payer: Self-pay

## 2023-05-01 ENCOUNTER — Encounter (HOSPITAL_COMMUNITY): Payer: Self-pay

## 2023-05-01 DIAGNOSIS — R718 Other abnormality of red blood cells: Secondary | ICD-10-CM | POA: Diagnosis not present

## 2023-05-01 DIAGNOSIS — Z79899 Other long term (current) drug therapy: Secondary | ICD-10-CM | POA: Diagnosis not present

## 2023-05-01 DIAGNOSIS — Z8673 Personal history of transient ischemic attack (TIA), and cerebral infarction without residual deficits: Secondary | ICD-10-CM | POA: Insufficient documentation

## 2023-05-01 DIAGNOSIS — R479 Unspecified speech disturbances: Secondary | ICD-10-CM | POA: Insufficient documentation

## 2023-05-01 DIAGNOSIS — R519 Headache, unspecified: Secondary | ICD-10-CM | POA: Diagnosis present

## 2023-05-01 DIAGNOSIS — Z7982 Long term (current) use of aspirin: Secondary | ICD-10-CM | POA: Insufficient documentation

## 2023-05-01 DIAGNOSIS — E039 Hypothyroidism, unspecified: Secondary | ICD-10-CM | POA: Diagnosis not present

## 2023-05-01 DIAGNOSIS — R569 Unspecified convulsions: Secondary | ICD-10-CM

## 2023-05-01 DIAGNOSIS — R930 Abnormal findings on diagnostic imaging of skull and head, not elsewhere classified: Secondary | ICD-10-CM | POA: Diagnosis not present

## 2023-05-01 LAB — I-STAT CHEM 8, ED
BUN: 21 mg/dL — ABNORMAL HIGH (ref 6–20)
Calcium, Ion: 1.04 mmol/L — ABNORMAL LOW (ref 1.15–1.40)
Chloride: 104 mmol/L (ref 98–111)
Creatinine, Ser: 0.9 mg/dL (ref 0.44–1.00)
Glucose, Bld: 81 mg/dL (ref 70–99)
HCT: 50 % — ABNORMAL HIGH (ref 36.0–46.0)
Hemoglobin: 17 g/dL — ABNORMAL HIGH (ref 12.0–15.0)
Potassium: 3.7 mmol/L (ref 3.5–5.1)
Sodium: 138 mmol/L (ref 135–145)
TCO2: 26 mmol/L (ref 22–32)

## 2023-05-01 LAB — PROTIME-INR
INR: 1.1 (ref 0.8–1.2)
Prothrombin Time: 14.3 s (ref 11.4–15.2)

## 2023-05-01 LAB — CBC WITH DIFFERENTIAL/PLATELET
Abs Immature Granulocytes: 0.03 10*3/uL (ref 0.00–0.07)
Basophils Absolute: 0 10*3/uL (ref 0.0–0.1)
Basophils Relative: 0 %
Eosinophils Absolute: 0.1 10*3/uL (ref 0.0–0.5)
Eosinophils Relative: 2 %
HCT: 45.2 % (ref 36.0–46.0)
Hemoglobin: 14.5 g/dL (ref 12.0–15.0)
Immature Granulocytes: 0 %
Lymphocytes Relative: 50 %
Lymphs Abs: 3.5 10*3/uL (ref 0.7–4.0)
MCH: 29.8 pg (ref 26.0–34.0)
MCHC: 32.1 g/dL (ref 30.0–36.0)
MCV: 93 fL (ref 80.0–100.0)
Monocytes Absolute: 0.5 10*3/uL (ref 0.1–1.0)
Monocytes Relative: 8 %
Neutro Abs: 2.7 10*3/uL (ref 1.7–7.7)
Neutrophils Relative %: 40 %
Platelets: 323 10*3/uL (ref 150–400)
RBC: 4.86 MIL/uL (ref 3.87–5.11)
RDW: 14.5 % (ref 11.5–15.5)
WBC: 7 10*3/uL (ref 4.0–10.5)
nRBC: 0 % (ref 0.0–0.2)

## 2023-05-01 LAB — COMPREHENSIVE METABOLIC PANEL
ALT: 36 U/L (ref 0–44)
AST: 39 U/L (ref 15–41)
Albumin: 4.1 g/dL (ref 3.5–5.0)
Alkaline Phosphatase: 85 U/L (ref 38–126)
Anion gap: 14 (ref 5–15)
BUN: 19 mg/dL (ref 6–20)
CO2: 22 mmol/L (ref 22–32)
Calcium: 9.8 mg/dL (ref 8.9–10.3)
Chloride: 101 mmol/L (ref 98–111)
Creatinine, Ser: 1.02 mg/dL — ABNORMAL HIGH (ref 0.44–1.00)
GFR, Estimated: >60 mL/min (ref >60)
Glucose, Bld: 80 mg/dL (ref 70–99)
Potassium: 3.9 mmol/L (ref 3.5–5.1)
Sodium: 137 mmol/L (ref 135–145)
Total Bilirubin: 0.6 mg/dL (ref <1.2)
Total Protein: 8.4 g/dL — ABNORMAL HIGH (ref 6.5–8.1)

## 2023-05-01 LAB — CBG MONITORING, ED: Glucose-Capillary: 83 mg/dL (ref 70–99)

## 2023-05-01 LAB — APTT: aPTT: 30 s (ref 24–36)

## 2023-05-01 LAB — HCG, SERUM, QUALITATIVE: Preg, Serum: NEGATIVE

## 2023-05-01 LAB — ETHANOL: Alcohol, Ethyl (B): <10 mg/dL (ref <10)

## 2023-05-01 NOTE — Code Documentation (Signed)
Natalie Vasquez is a 52 yr old female with a PMH of anxiety, PTSD, GERD and heavy uterine bleeding. She presented to [facility] via POV on 05/01/2023. Pt is coming from home where she was last known well today at 1500. After this time, she began having difficulty speaking. She said this has happened before. She is not on blood thinners.     Code stroke activated in ED. Stroke team to Pt's room in ED. CBG, labs obtained. NIHSS 1, for aphasia. Pt has a stuttering halting speech which she says is abnormal. Pt to CT with team. The following imaging was obtained: CT. Per Dr. Iver Nestle, CT negative for hemorrhage. Pt then taken for STAT MRI for thrombolytic treatment decision as pt is still in the window for TNK. Per Dr. Iver Nestle, MRI is negative for stroke. Handoff with ED RN occurred while pt in MRI. Pt is ineligible for TNK and IR due to MRI negative for stroke.

## 2023-05-01 NOTE — Consult Note (Signed)
NEUROLOGY CONSULT NOTE   Date of service: May 01, 2023 Patient Name: Natalie Vasquez MRN:  [id number] DOB:  [birth year] Chief Complaint: "Code stroke for speech disturbance" Requesting Provider: Lonell Grandchild, MD  History of Present Illness  Natalie Vasquez is a 52 y.o. female  has a past medical history of Anxiety, Arthritis, Depression, Fibrosis, breast, left, GERD (gastroesophageal reflux disease), Graves disease, Hypothyroid, Obstructive sleep apnea, Pancreatitis, PTSD (post-traumatic stress disorder), and Urinary incontinence.   Patient presents with acute onset stuttering speech disturbance with dysarthria.  She has had presentations for this before, the last in 2023, and MRI has been negative.  She has had continued episodes of stuttering speech and she reports the last was 2 months ago but they typically last only few minutes.  She presented today because the episode was not resolving.  Otherwise she denies any new symptoms within limits of her intermittently stuttering speech  She does have a history of protein S deficiency for which she has been on anticoagulation in the past but this was stopped due to abnormal uterine bleeding which was treated with IUD placement.   LKW: 1500 Modified rankin score: 1-No significant post stroke disability and can perform usual duties with stroke symptoms IV Thrombolysis: No, no stroke seen on MRI EVT: No, no stroke seen on MRI  NIHSS components Score: Comment  1a Level of Conscious 0[x]  1[]  2[]  3[]      1b LOC Questions 0[x]  1[]  2[]       1c LOC Commands 0[x]  1[]  2[]       2 Best Gaze 0[x]  1[]  2[]       3 Visual 0[x]  1[]  2[]  3[]      4 Facial Palsy 0[x]  1[]  2[]  3[]      5a Motor Arm - left 0[x]  1[]  2[]  3[]  4[]  UN[]    5b Motor Arm - Right 0[x]  1[]  2[]  3[]  4[]  UN[]    6a Motor Leg - Left 0[x]  1[]  2[]  3[]  4[]  UN[]    6b Motor Leg - Right 0[]  1[x]  2[]  3[]  4[]  UN[]    7 Limb Ataxia 0[x]  1[]  2[]  3[]  UN[]     8 Sensory 0[x]  1[]  2[]  UN[]       9 Best Language 0[]  1[x]  2[]  3[]      10 Dysarthria 0[]  1[x]  2[]  UN[]      11 Extinct. and Inattention 0[x]  1[]  2[]       TOTAL:3       ROS  Limited by stuttering speech but negative TNK checklist  Past History   Past Medical History:  Diagnosis Date   Anxiety    Arthritis    Depression    Fibrosis, breast, left    GERD (gastroesophageal reflux disease)    Graves disease    Hypothyroid    Obstructive sleep apnea    uses CPAP at night   Pancreatitis    PTSD (post-traumatic stress disorder)    Urinary incontinence     Past Surgical History:  Procedure Laterality Date   BREAST REDUCTION SURGERY Bilateral 1997   CHOLECYSTECTOMY  1998   KNEE ARTHROSCOPY Left 2017   LIPOSUCTION Bilateral 1997   hips and thighs    Family History: History reviewed. No pertinent family history.  Social History  reports that she has never smoked. She has never used smokeless tobacco. She reports that she does not drink alcohol and does not use drugs.  Allergies  Allergen Reactions   Buspirone    Flunisolide    Isoniazid    Menthol    Metoclopramide Other (  See Comments)    Dystonia reaction   Quetiapine    Topiramate     Medications  No current facility-administered medications for this encounter.  Current Outpatient Medications:    levothyroxine (SYNTHROID) 100 MCG tablet, Take 1 tablet by mouth daily. Take along with 75 mcg tablet=175 mcg, Disp: , Rfl:    aspirin 81 MG chewable tablet, Chew 1 tablet (81 mg total) by mouth daily., Disp: 30 tablet, Rfl: 0   DULoxetine (CYMBALTA) 60 MG capsule, Take 120 mg by mouth at bedtime., Disp: , Rfl:    lidocaine (LIDODERM) 5 %, Place 1 patch onto the skin daily as needed (pain)., Disp: , Rfl:    medroxyPROGESTERone (PROVERA) 5 MG tablet, Take 2 tablets (10 mg total) by mouth every 8 (eight) hours for 14 days., Disp: 84 tablet, Rfl: 0   meloxicam (MOBIC) 15 MG tablet, Take 15 mg by mouth 2 (two) times daily., Disp: , Rfl:    oxybutynin  (DITROPAN-XL) 10 MG 24 hr tablet, Take 5 mg by mouth 2 (two) times daily., Disp: , Rfl:    pantoprazole (PROTONIX) 40 MG tablet, Take 40 mg by mouth at bedtime., Disp: , Rfl:    REFRESH 1.4-0.6 % SOLN, Apply 1 drop to eye daily as needed (dry eyes)., Disp: , Rfl:    traZODone (DESYREL) 100 MG tablet, Take 100 mg by mouth at bedtime., Disp: , Rfl:   Vitals   Vitals:   03-May-2023 1659 05/03/2023 1700 05/03/23 1745 May 03, 2023 1800  BP: 129/72 129/72 118/67 115/66  Pulse: 81 79 72 77  Resp: 16 16 16 16   Temp:      TempSrc:      SpO2: 100% 100% 100% 100%  Weight:      Height:        Body mass index is 40.56 kg/m.  Physical Exam   Constitutional: Appears well-developed and well-nourished.  Psych: Affect appropriate to situation.  Eyes: No scleral injection.  HENT: No OP obstruction.  Head: Normocephalic.  Cardiovascular: Normal rate and regular rhythm.  Respiratory: Effort normal, non-labored breathing.  GI: Soft.  No distension. There is no tenderness.  Skin: WDI.   Neurologic Examination    Neuro: Mental Status: Patient is awake, alert, oriented to person, place, month, year, and situation. Patient is able to give a clear and coherent history, including noting that her stroke workup was positive only for protein deficiency without stroke Able to clearly communicate important concepts with intermittent hesitant/stuttering speech Cranial Nerves: II: Visual Fields are full. Pupils are equal, round, and reactive to light.   III,IV, VI: EOMI without ptosis or diploplia.  V: Facial sensation is symmetric to temperature VII: Facial movement is symmetric.  VIII: hearing is intact to voice X: Uvula elevates symmetrically XI: Shoulder shrug is symmetric. XII: tongue is midline without atrophy or fasciculations.  Motor: No drift in bilateral upper extremities.  Slight drift in the right leg.  No drift in the left leg Sensory: Sensation is symmetric to light touch and temperature in  the arms and legs. Cerebellar: FNF and HKS are intact bilaterally   Labs/Imaging/Neurodiagnostic studies   CBC:  Recent Labs  Lab 05/03/23 1708 2023/05/03 1835  WBC  --  7.0  NEUTROABS  --  2.7  HGB 17.0* 14.5  HCT 50.0* 45.2  MCV  --  93.0  PLT  --  323   Basic Metabolic Panel:  Lab Results  Component Value Date   NA 138 2023-05-03   K 3.7 2023-05-03  CO2 22 05/01/2023   GLUCOSE 81 05/01/2023   BUN 21 (H) 05/01/2023   CREATININE 0.90 05/01/2023   CALCIUM 9.8 05/01/2023   GFRNONAA >60 05/01/2023   GFRAA >60 12/15/2018   Lipid Panel:  Lab Results  Component Value Date   LDLCALC 78 07/28/2021   HgbA1c:  Lab Results  Component Value Date   HGBA1C 6.1 (H) 07/28/2021   Urine Drug Screen:     Component Value Date/Time   LABOPIA NONE DETECTED 07/01/2021 1208   COCAINSCRNUR NONE DETECTED 07/01/2021 1208   LABBENZ NONE DETECTED 07/01/2021 1208   AMPHETMU NONE DETECTED 07/01/2021 1208   THCU NONE DETECTED 07/01/2021 1208   LABBARB NONE DETECTED 07/01/2021 1208    Alcohol Level     Component Value Date/Time   ETH <10 05/01/2023 1656   INR  Lab Results  Component Value Date   INR SPECIMEN CLOTTED 05/01/2023   APTT  Lab Results  Component Value Date   APTT 30 08/12/2021   CT Head without contrast(Personally reviewed): no acute intracranial process   MRI Brain(Personally reviewed): No acute intracranial process.   Neurodiagnostics rEEG:  This study is within normal limits. No seizures or epileptiform discharges were seen throughout the recording. A normal interictal EEG does not exclude the diagnosis of epilepsy.    ASSESSMENT   Recurrent stuttering symptoms.  Given very stereotyped nature of the events as she describes without any other focal neurological issues, this would be very inconsistent with a global hypercoagulable process such as protein S deficiency which would be expected to cause different symptoms at different times.  Given the  fluctuating nature of her symptoms, focal seizures felt to be less likely but given her cognitive complaints will also assess for seizure activity with EEG.  MRI brain was obtained emergently as patient did feel her symptoms were disabling.  She has previously had vessel imaging for these same symptoms (MRA neck with without contrast, MRA head without contrast), which were negative and I do not feel that these studies need to be repeated at this time  RECOMMENDATIONS  -Emergent MRI brain to rule out acute process (resulted, negative) -Stat EEG to rule out underlying ictal/interictal abnormalities (resulted, negative) -As these two studies are negative, supportive care and outpatient follow-up is appropriate  ___________________________________________________________________    Signed, Cortney Harland Dingwall, NP [facility]   Attending Neurologist's note:  I personally saw this patient, gathering history, performing a full neurologic examination, reviewing relevant labs, personally reviewing relevant imaging including head CT, MRI brain, and formulated the assessment and plan, adding the note above for completeness and clarity to accurately reflect my thoughts  CRITICAL CARE Performed by: Gordy Councilman   Total critical care time: 50 minutes  Critical care time was exclusive of separately billable procedures and treating other patients.  Critical care was necessary to treat or prevent imminent or life-threatening deterioration.  Critical care was time spent personally by me on the following activities: development of treatment plan with patient and/or surrogate as well as nursing, discussions with consultants, evaluation of patient's response to treatment, examination of patient, obtaining history from patient or surrogate, ordering and performing treatments and interventions, ordering and review of laboratory studies, ordering and review of radiographic studies, pulse  oximetry and re-evaluation of patient's condition.

## 2023-05-01 NOTE — ED Triage Notes (Signed)
Pt bib [facility] coming from home with a complaint of headache that began around 1500 today. Pt reports to EMS changes to speech, which they say is a stutter that also began around 1500. Pt does have hx of stroke in January this year and does have gait issues, she uses a walker/cane sometimes. Pt was on Xarelto but has not taken it in 6 months. Pt reports she saw a neurologist about 2 weeks due to "memory problems".   EMS vital signs:  142/78 98% RA 80 HR, Sinus Rhythm 119 cbg

## 2023-05-01 NOTE — Procedures (Signed)
Patient Name: Natalie Vasquez  MRN: [id number]  Epilepsy Attending: Charlsie Quest  Referring Physician/Provider: Marjorie Smolder, NP  Date: 05/01/2023 Duration: 22.22 mins  Patient history: 52 yo F with headache and speech abnormalities. EEG to evaluate for seizure  Level of alertness: Awake  AEDs during EEG study: None  Technical aspects: This EEG study was done with scalp electrodes positioned according to the 10-20 International system of electrode placement. Electrical activity was reviewed with band pass filter of 1-70Hz , sensitivity of 7 uV/mm, display speed of 79mm/sec with a 60Hz  notched filter applied as appropriate. EEG data were recorded continuously and digitally stored.  Video monitoring was available and reviewed as appropriate.  Description: The posterior dominant rhythm consists of 8-90 Hz activity of moderate voltage (25-35 uV) seen predominantly in posterior head regions, symmetric and reactive to eye opening and eye closing. Hyperventilation and photic stimulation were not performed.     IMPRESSION: This study is within normal limits. No seizures or epileptiform discharges were seen throughout the recording.  A normal interictal EEG does not exclude the diagnosis of epilepsy.  Myrta Mercer Annabelle Harman

## 2023-05-01 NOTE — ED Notes (Signed)
Pt denies numbness, weakness, tingling, shortness of breath.

## 2023-05-01 NOTE — Progress Notes (Signed)
EEG complete - results pending 

## 2023-05-01 NOTE — Discharge Instructions (Signed)
We evaluated you for your headache and speech changes.  You were also evaluated by neurology.  We obtained imaging including MRI of your brain and CT scans.  We did not see any signs of a stroke or bleeding in your brain.  The neurologist also performed an EEG which did not show any signs of a seizure.  They would like you to follow-up with your outpatient neurologist.  Please call them tomorrow to schedule follow-up.  If you develop any new or worsening symptoms such as severe headache, numbness or tingling, weakness, vision changes, facial droop, trouble walking, nausea or vomiting, or any other new symptoms, please return to the emergency department.
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
## 2023-05-01 NOTE — ED Notes (Signed)
Received notification that new blood tubes needed to be sent to lab. Phlebotomy notified and at bedside. MD notified of delay.

## 2024-01-04 ENCOUNTER — Emergency Department (HOSPITAL_BASED_OUTPATIENT_CLINIC_OR_DEPARTMENT_OTHER)

## 2024-01-04 ENCOUNTER — Encounter (HOSPITAL_BASED_OUTPATIENT_CLINIC_OR_DEPARTMENT_OTHER): Payer: Self-pay | Admitting: Emergency Medicine

## 2024-01-04 ENCOUNTER — Other Ambulatory Visit: Payer: Self-pay

## 2024-01-04 ENCOUNTER — Emergency Department (HOSPITAL_BASED_OUTPATIENT_CLINIC_OR_DEPARTMENT_OTHER)
Admission: EM | Admit: 2024-01-04 | Discharge: 2024-01-04 | Disposition: A | Discharge status: Home / Self Care | Attending: Emergency Medicine | Admitting: Emergency Medicine

## 2024-01-04 DIAGNOSIS — Z7982 Long term (current) use of aspirin: Secondary | ICD-10-CM | POA: Insufficient documentation

## 2024-01-04 DIAGNOSIS — Z79899 Other long term (current) drug therapy: Secondary | ICD-10-CM | POA: Insufficient documentation

## 2024-01-04 DIAGNOSIS — E039 Hypothyroidism, unspecified: Secondary | ICD-10-CM | POA: Diagnosis not present

## 2024-01-04 DIAGNOSIS — Z8673 Personal history of transient ischemic attack (TIA), and cerebral infarction without residual deficits: Secondary | ICD-10-CM | POA: Diagnosis not present

## 2024-01-04 DIAGNOSIS — R079 Chest pain, unspecified: Secondary | ICD-10-CM | POA: Diagnosis present

## 2024-01-04 DIAGNOSIS — R0789 Other chest pain: Secondary | ICD-10-CM | POA: Insufficient documentation

## 2024-01-04 HISTORY — DX: Transient cerebral ischemic attack, unspecified: G45.9

## 2024-01-04 LAB — CBC WITH DIFFERENTIAL/PLATELET
Abs Immature Granulocytes: 0.02 K/uL (ref 0.00–0.07)
Basophils Absolute: 0 K/uL (ref 0.0–0.1)
Basophils Relative: 0 %
Eosinophils Absolute: 0.1 K/uL (ref 0.0–0.5)
Eosinophils Relative: 2 %
HCT: 42.6 % (ref 36.0–46.0)
Hemoglobin: 13.7 g/dL (ref 12.0–15.0)
Immature Granulocytes: 0 %
Lymphocytes Relative: 53 %
Lymphs Abs: 3.7 K/uL (ref 0.7–4.0)
MCH: 29.5 pg (ref 26.0–34.0)
MCHC: 32.2 g/dL (ref 30.0–36.0)
MCV: 91.6 fL (ref 80.0–100.0)
Monocytes Absolute: 0.5 K/uL (ref 0.1–1.0)
Monocytes Relative: 7 %
Neutro Abs: 2.7 K/uL (ref 1.7–7.7)
Neutrophils Relative %: 38 %
Platelets: 306 K/uL (ref 150–400)
RBC: 4.65 MIL/uL (ref 3.87–5.11)
RDW: 14.6 % (ref 11.5–15.5)
WBC: 7 K/uL (ref 4.0–10.5)
nRBC: 0 % (ref 0.0–0.2)

## 2024-01-04 LAB — TROPONIN T, HIGH SENSITIVITY
Troponin T High Sensitivity: <15 ng/L (ref <19)
Troponin T High Sensitivity: <15 ng/L (ref <19)

## 2024-01-04 LAB — COMPREHENSIVE METABOLIC PANEL WITH GFR
ALT: 20 U/L (ref 0–44)
AST: 25 U/L (ref 15–41)
Albumin: 4.3 g/dL (ref 3.5–5.0)
Alkaline Phosphatase: 90 U/L (ref 38–126)
Anion gap: 12 (ref 5–15)
BUN: 18 mg/dL (ref 6–20)
CO2: 24 mmol/L (ref 22–32)
Calcium: 9.8 mg/dL (ref 8.9–10.3)
Chloride: 103 mmol/L (ref 98–111)
Creatinine, Ser: 1.03 mg/dL — ABNORMAL HIGH (ref 0.44–1.00)
GFR, Estimated: >60 mL/min (ref >60)
Glucose, Bld: 88 mg/dL (ref 70–99)
Potassium: 3.8 mmol/L (ref 3.5–5.1)
Sodium: 138 mmol/L (ref 135–145)
Total Bilirubin: 0.4 mg/dL (ref 0.0–1.2)
Total Protein: 7.7 g/dL (ref 6.5–8.1)

## 2024-01-04 LAB — URINALYSIS, ROUTINE W REFLEX MICROSCOPIC
Bilirubin Urine: NEGATIVE
Glucose, UA: NEGATIVE mg/dL
Hgb urine dipstick: NEGATIVE
Ketones, ur: 15 mg/dL — AB
Leukocytes,Ua: NEGATIVE
Nitrite: NEGATIVE
Protein, ur: NEGATIVE mg/dL
Specific Gravity, Urine: 1.03 (ref 1.005–1.030)
pH: 6 (ref 5.0–8.0)

## 2024-01-04 LAB — CBG MONITORING, ED: Glucose-Capillary: 81 mg/dL (ref 70–99)

## 2024-01-04 MED ORDER — MORPHINE SULFATE (PF) 4 MG/ML IV SOLN
4.0000 mg | INTRAVENOUS | Status: DC | PRN
  Administered 2024-01-04: 4 mg via INTRAVENOUS
  Filled 2024-01-04: qty 1

## 2024-01-04 MED ORDER — ASPIRIN 81 MG PO CHEW
162.0000 mg | CHEWABLE_TABLET | Freq: Once | ORAL | Status: AC
  Administered 2024-01-04: 162 mg via ORAL
  Filled 2024-01-04: qty 2

## 2024-01-04 MED ORDER — ASPIRIN 81 MG PO CHEW: 243.0000 mg | CHEWABLE_TABLET | Freq: Once | ORAL | Status: DC

## 2024-01-04 MED ORDER — ASPIRIN 81 MG PO CHEW: 324.0000 mg | CHEWABLE_TABLET | Freq: Once | ORAL | Status: DC

## 2024-01-04 NOTE — ED Provider Notes (Signed)
 [place] EMERGENCY DEPARTMENT AT [facility] Provider Note   CSN: [id number] Arrival date & time: 01/04/24  1515     Patient presents with: Chest Pain   Natalie Vasquez is a 53 y.o. female.  Patient with past history significant for chronic pancreatitis, TIA, major depressive disorder, migraines, hypothyroidism presents to the emergency department concerns of chest pain.  States that she began to experience enteritis pain around 2 PM.  States that she took 81 mg of baby aspirin  which improved her symptoms.  She had began to experience some recurrent pain with this time radiating towards the left jaw and left arm.  Repeated dose of aspirin  with no change in symptoms at that time.  Denies any prior cardiac history but states that she has been seen by cardiology and had an unremarkable stress test.  Denies any feelings of nausea, diaphoresis, shortness of breath as the symptoms arose today.  Patient reportedly takes aspirin  every 2 or 3 days due to her protein S deficiency.   Chest Pain      Prior to Admission medications   Medication Sig Start Date End Date Taking? Authorizing Provider  aspirin  81 MG chewable tablet Chew 1 tablet (81 mg total) by mouth daily. 07/01/21   Patt Alm Macho, MD  DULoxetine (CYMBALTA) 60 MG capsule Take 120 mg by mouth at bedtime. 04/07/21   [provider]  levothyroxine (SYNTHROID) 100 MCG tablet Take 1 tablet by mouth daily. Take along with 75 mcg tablet=175 mcg    [provider]  lidocaine (LIDODERM) 5 % Place 1 patch onto the skin daily as needed (pain). 03/09/21   [provider]  medroxyPROGESTERone  (PROVERA ) 5 MG tablet Take 2 tablets (10 mg total) by mouth every 8 (eight) hours for 14 days. 01/05/22 01/19/22  Loeffler, Ronnald BROCKS, PA-C  meloxicam (MOBIC) 15 MG tablet Take 15 mg by mouth 2 (two) times daily.    [provider]  oxybutynin (DITROPAN-XL) 10 MG 24 hr tablet Take 5 mg by mouth 2 (two) times daily.     [provider]  pantoprazole (PROTONIX) 40 MG tablet Take 40 mg by mouth at bedtime.    [provider]  REFRESH 1.4-0.6 % SOLN Apply 1 drop to eye daily as needed (dry eyes). 03/26/21   [provider]  traZODone (DESYREL) 100 MG tablet Take 100 mg by mouth at bedtime.    [provider]    Allergies: Buspirone, Flunisolide, Isoniazid, Menthol, Metoclopramide, Quetiapine, and Topiramate    Review of Systems  Cardiovascular:  Positive for chest pain.  All other systems reviewed and are negative.   Updated Vital Signs BP 137/81   Pulse 66   Temp 98.6 F (37 C) (Oral)   Resp 17   SpO2 98%   Physical Exam Vitals and nursing note reviewed.  Constitutional:      General: She is not in acute distress.    Appearance: She is well-developed.  HENT:     Head: Normocephalic and atraumatic.  Eyes:     Extraocular Movements: Extraocular movements intact.     Conjunctiva/sclera: Conjunctivae normal.     Pupils: Pupils are equal, round, and reactive to light.  Cardiovascular:     Rate and Rhythm: Normal rate and regular rhythm.     Heart sounds: Normal heart sounds. No murmur heard. Pulmonary:     Effort: Pulmonary effort is normal. No respiratory distress.     Breath sounds: Normal breath sounds. No decreased breath sounds,  wheezing, rhonchi or rales.  Abdominal:     Palpations: Abdomen is soft.     Tenderness: There is no abdominal tenderness.  Musculoskeletal:        General: No swelling.     Cervical back: Neck supple.  Skin:    General: Skin is warm and dry.     Capillary Refill: Capillary refill takes less than 2 seconds.  Neurological:     Mental Status: She is alert.  Psychiatric:        Mood and Affect: Mood normal.     (all labs ordered are listed, but only abnormal results are displayed) Labs Reviewed  COMPREHENSIVE METABOLIC PANEL WITH GFR - Abnormal; Notable for the following components:      Result Value   Creatinine, Ser  1.03 (*)    All other components within normal limits  URINALYSIS, ROUTINE W REFLEX MICROSCOPIC - Abnormal; Notable for the following components:   Ketones, ur 15 (*)    All other components within normal limits  CBC WITH DIFFERENTIAL/PLATELET  CBG MONITORING, ED  TROPONIN T, HIGH SENSITIVITY  TROPONIN T, HIGH SENSITIVITY    EKG: EKG Interpretation Date/Time:  Thursday January 04 2024 15:25:06 EDT Ventricular Rate:  67 PR Interval:  148 QRS Duration:  92 QT Interval:  397 QTC Calculation: 420 R Axis:   13  Text Interpretation: Sinus rhythm Abnormal R-wave progression, early transition Confirmed by Ruthe Cornet [phone]) on 01/04/2024 3:27:58 PM  Radiology: DG Chest Portable 1 View Result Date: 01/04/2024 EXAM: PORTABLE CHEST - 1 VIEW COMPARISON:  None available. FINDINGS: No focal airspace consolidation, pleural effusion, or pneumothorax. No cardiomegaly. No acute fracture or destructive lesion. IMPRESSION: No acute cardiopulmonary abnormality. Electronically Signed   By: Rogelia Myers M.D.   On: 01/04/2024 15:51     Procedures   Medications Ordered in the ED  morphine  (PF) 4 MG/ML injection 4 mg (4 mg Intravenous Given 01/04/24 1614)  aspirin  chewable tablet 162 mg (162 mg Oral Given 01/04/24 1545)                                    Medical Decision Making Risk OTC drugs. Prescription drug management.   This patient presents to the ED for concern of chest pain, this involves an extensive number of treatment options, and is a complaint that carries with it a high risk of complications and morbidity.  The differential diagnosis includes ACS, PE, pneumonia, bronchitis, GERD, atypical chest pain   Co morbidities that complicate the patient evaluation  Chronic pancreatitis, GAD, major depressive disorder, migraines, hypothyroidism, GERD   Lab Tests:  I Ordered, and personally interpreted labs.  The pertinent results include: CBC unremarkable, CBG normal at 81, troponin  negative x 2, UA negative, CMP unremarkable   Imaging Studies ordered:  I ordered imaging studies including chest x-ray I independently visualized and interpreted imaging which showed no acute cardiopulmonary process I agree with the radiologist interpretation   Cardiac Monitoring: / EKG:  The patient was maintained on a cardiac monitor.  I personally viewed and interpreted the cardiac monitored which showed an underlying rhythm of: Sinus rhythm but no evidence of ischemia   Consultations Obtained:  I requested consultation with none,  and discussed lab and imaging findings as well as pertinent plan - they recommend: N/A   Problem List / ED Course / Critical interventions / Medication management  Patient with past history significant for  chronic pancreatitis, GAD, major depressive disorder, migraines, hypothyroidism, GERD presents ED with concerns of chest pain.  She reports that her pain began around 2 PM and 81 mg of aspirin  which did improve her symptoms but pain returned later.  States she took another dose of aspirin  without improvement at that time.  Reports that the pain was substernal in nature as a progressed towards some radiating left-sided pain in her neck and her arm.  Denies any history of any cardiac abnormalities in previously seen by cardiology with unremarkable stress test at that time.  Denies any nausea, vomiting, diaphoresis, or feelings of lightheadedness. On exam, patient is well-appearing.  No focal chest wall tenderness.  Normal heart or lung sounds.  Abdominal exam is also unremarkable with normal bowel sounds and no tenderness to palpation.  No epigastric pain.  Patient with symptoms, will need to rule out for ACS with labs, imaging, and EKG.  Pertinent orders placed.  Patient given 162 mg of aspirin  to supplement the 160 mg of aspirin  she received earlier.  Morphine  as needed added on for further pain control if needed.  Will reassess shortly. On reevaluation,  patient reports improvement after morphine  and aspirin .  She denies any pain at this time.  No reported nausea, dizziness, diaphoresis.  Her current labs show troponin is negative on initial read.  Will repeat troponin.  CBC and CMP unremarkable.  Chest x-ray negative.  EKG is nonischemic and on heart monitor, has had no significant changes while here.  With no reported shortness of breath or pleuritic chest pain, PE unlikely. Repeat troponin is negative.  With 2 troponins being negative and EKG is nonischemic, do not feel that this is an NSTEMI or other more concerning etiology.  With patient story, I do think she would benefit from outpatient cardiology follow-up.  Referral to cardiology placed.  Return precautions discussed such as concerns for new or worsening symptoms.  Otherwise stable for outpatient follow-up and discharged home. I ordered medication including aspirin , morphine  for chest pain Reevaluation of the patient after these medicines showed that the patient improved I have reviewed the patients home medicines and have made adjustments as needed   Social Determinants of Health:  None   Test / Admission - Considered:  Consider admission with patient stable for outpatient follow-up.  Referral to cardiology placed.  Final diagnoses:  Atypical chest pain    ED Discharge Orders          Ordered    Ambulatory referral to Cardiology       Comments: If you have not heard from the Cardiology office within the next 72 hours please call [phone].   01/04/24 1820               Cecily Legrand LABOR, PA-C 01/04/24 1832    Ruthe Cornet, DO 01/04/24 1907

## 2024-01-04 NOTE — Discharge Instructions (Signed)
 You are seen in the emergency department today for concerns of chest pain.  Your labs and imaging were thankfully reassuring without any abnormal findings.  Your troponin levels were checked for any concerns of possible heart strain or damage were negative on both occasions.  However, your story is abnormal and I would strongly recommend that you follow-up with cardiology.  I placed a referral to cardiology for you to be seen.  For any concerns of new or worsening symptoms, return to the emergency department.

## 2024-01-04 NOTE — ED Notes (Signed)
 Discharge instructions and follow up care with cards reviewed and explained, pt verbalized understanding and had no further questions on d/c. Pt caox4, NAD on d/c.

## 2024-01-04 NOTE — ED Triage Notes (Signed)
 Substernal chest pain  Started around 2pm Took asa 81, symptoms improved Started again Pain in chest, jaw and down left arm Took another asa 81

## 2024-01-05 ENCOUNTER — Encounter (INDEPENDENT_AMBULATORY_CARE_PROVIDER_SITE_OTHER): Payer: Self-pay

## 2024-03-05 ENCOUNTER — Emergency Department (HOSPITAL_BASED_OUTPATIENT_CLINIC_OR_DEPARTMENT_OTHER): Admitting: Radiology

## 2024-03-05 ENCOUNTER — Encounter (HOSPITAL_BASED_OUTPATIENT_CLINIC_OR_DEPARTMENT_OTHER): Payer: Self-pay

## 2024-03-05 ENCOUNTER — Emergency Department (HOSPITAL_BASED_OUTPATIENT_CLINIC_OR_DEPARTMENT_OTHER)

## 2024-03-05 ENCOUNTER — Emergency Department (HOSPITAL_BASED_OUTPATIENT_CLINIC_OR_DEPARTMENT_OTHER)
Admission: EM | Admit: 2024-03-05 | Discharge: 2024-03-05 | Disposition: A | Discharge status: Home / Self Care | Attending: Emergency Medicine | Admitting: Emergency Medicine

## 2024-03-05 ENCOUNTER — Other Ambulatory Visit: Payer: Self-pay

## 2024-03-05 DIAGNOSIS — R079 Chest pain, unspecified: Secondary | ICD-10-CM | POA: Insufficient documentation

## 2024-03-05 DIAGNOSIS — R1012 Left upper quadrant pain: Secondary | ICD-10-CM | POA: Diagnosis not present

## 2024-03-05 DIAGNOSIS — R197 Diarrhea, unspecified: Secondary | ICD-10-CM | POA: Insufficient documentation

## 2024-03-05 DIAGNOSIS — E039 Hypothyroidism, unspecified: Secondary | ICD-10-CM | POA: Diagnosis not present

## 2024-03-05 DIAGNOSIS — R1013 Epigastric pain: Secondary | ICD-10-CM | POA: Diagnosis present

## 2024-03-05 DIAGNOSIS — R1085 Abdominal pain of multiple sites: Secondary | ICD-10-CM

## 2024-03-05 DIAGNOSIS — Z7982 Long term (current) use of aspirin: Secondary | ICD-10-CM | POA: Diagnosis not present

## 2024-03-05 HISTORY — DX: Cardiomegaly: I51.7

## 2024-03-05 LAB — URINALYSIS, ROUTINE W REFLEX MICROSCOPIC
Bacteria, UA: NONE SEEN
Bilirubin Urine: NEGATIVE
Glucose, UA: NEGATIVE mg/dL
Hgb urine dipstick: NEGATIVE
Ketones, ur: NEGATIVE mg/dL
Nitrite: NEGATIVE
Specific Gravity, Urine: 1.023 (ref 1.005–1.030)
pH: 5.5 (ref 5.0–8.0)

## 2024-03-05 LAB — COMPREHENSIVE METABOLIC PANEL WITH GFR
ALT: 18 U/L (ref 0–44)
AST: 26 U/L (ref 15–41)
Albumin: 4.4 g/dL (ref 3.5–5.0)
Alkaline Phosphatase: 98 U/L (ref 38–126)
Anion gap: 14 (ref 5–15)
BUN: 11 mg/dL (ref 6–20)
CO2: 22 mmol/L (ref 22–32)
Calcium: 10.3 mg/dL (ref 8.9–10.3)
Chloride: 99 mmol/L (ref 98–111)
Creatinine, Ser: 0.77 mg/dL (ref 0.44–1.00)
GFR, Estimated: >60 mL/min (ref >60)
Glucose, Bld: 90 mg/dL (ref 70–99)
Potassium: 3.9 mmol/L (ref 3.5–5.1)
Sodium: 135 mmol/L (ref 135–145)
Total Bilirubin: 0.6 mg/dL (ref 0.0–1.2)
Total Protein: 8.4 g/dL — ABNORMAL HIGH (ref 6.5–8.1)

## 2024-03-05 LAB — CBC
HCT: 44.3 % (ref 36.0–46.0)
Hemoglobin: 14.8 g/dL (ref 12.0–15.0)
MCH: 30 pg (ref 26.0–34.0)
MCHC: 33.4 g/dL (ref 30.0–36.0)
MCV: 89.7 fL (ref 80.0–100.0)
Platelets: 334 K/uL (ref 150–400)
RBC: 4.94 MIL/uL (ref 3.87–5.11)
RDW: 14.4 % (ref 11.5–15.5)
WBC: 5.9 K/uL (ref 4.0–10.5)
nRBC: 0 % (ref 0.0–0.2)

## 2024-03-05 LAB — TROPONIN T, HIGH SENSITIVITY
Troponin T High Sensitivity: <15 ng/L (ref 0–19)
Troponin T High Sensitivity: <15 ng/L (ref 0–19)

## 2024-03-05 LAB — LIPASE, BLOOD: Lipase: 41 U/L (ref 11–51)

## 2024-03-05 MED ORDER — ONDANSETRON HCL 4 MG/2ML IJ SOLN
4.0000 mg | Freq: Once | INTRAMUSCULAR | Status: AC
  Administered 2024-03-05: 4 mg via INTRAVENOUS
  Filled 2024-03-05: qty 2

## 2024-03-05 MED ORDER — ONDANSETRON 4 MG PO TBDP: 4.0000 mg | ORAL_TABLET | Freq: Three times a day (TID) | ORAL | 0 refills | Status: AC | PRN

## 2024-03-05 MED ORDER — DICYCLOMINE HCL 10 MG/ML IM SOLN
20.0000 mg | Freq: Once | INTRAMUSCULAR | Status: AC
Start: 2024-03-05 — End: 2024-03-05
  Administered 2024-03-05: 20 mg via INTRAMUSCULAR
  Filled 2024-03-05: qty 2

## 2024-03-05 MED ORDER — DICYCLOMINE HCL 20 MG PO TABS: 20.0000 mg | ORAL_TABLET | Freq: Two times a day (BID) | ORAL | 0 refills | Status: AC

## 2024-03-05 MED ORDER — LACTATED RINGERS IV BOLUS
1000.0000 mL | Freq: Once | INTRAVENOUS | Status: AC
  Administered 2024-03-05: 1000 mL via INTRAVENOUS

## 2024-03-05 MED ORDER — IOHEXOL 300 MG/ML  SOLN
100.0000 mL | Freq: Once | INTRAMUSCULAR | Status: AC | PRN
  Administered 2024-03-05: 100 mL via INTRAVENOUS

## 2024-03-05 NOTE — ED Provider Notes (Signed)
 [place] EMERGENCY DEPARTMENT AT [facility] Provider Note   CSN: [id number] Arrival date & time: 03/05/24  1431     Patient presents with: Abdominal Pain and Chest Pain   Natalie Vasquez is a 53 y.o. female.  {Add pertinent medical, surgical, social history, OB history to HPI:32947} HPI      Intense abdominal pain Started out as mild this AM, but now more intense, epigastric sharp shooting pains from epigastricum to left and right side And going to chest 2 days ago had flu shot, cookie from neighbor, had gnawing sensation, abdominal pain, diarrhea Feels like pancreatitis has had in the past  No shortness of breath Nausea today, no vomiting. This AM had diarrhea then it stopped, smelled abnormal.  Hx of gallbladder Pancreatic stent/bile duct   Past Medical History:  Diagnosis Date   Anxiety    Arthritis    Cardiomegaly    Depression    Fibrosis, breast, left    GERD (gastroesophageal reflux disease)    Graves disease    Hypothyroid    Obstructive sleep apnea    uses CPAP at night   Pancreatitis    PTSD (post-traumatic stress disorder)    TIA (transient ischemic attack)    Urinary incontinence     Past Surgical History:  Procedure Laterality Date   BREAST REDUCTION SURGERY Bilateral 1997   CHOLECYSTECTOMY  1998   KNEE ARTHROSCOPY Left 2017   LIPOSUCTION Bilateral 1997   hips and thighs    Prior to Admission medications   Medication Sig Start Date End Date Taking? Authorizing Provider  aspirin  81 MG chewable tablet Chew 1 tablet (81 mg total) by mouth daily. 07/01/21   Patt Alm Macho, MD  DULoxetine (CYMBALTA) 60 MG capsule Take 120 mg by mouth at bedtime. 04/07/21   [provider]  levothyroxine (SYNTHROID) 100 MCG tablet Take 1 tablet by mouth daily. Take along with 75 mcg tablet=175 mcg    [provider]  lidocaine (LIDODERM) 5 % Place 1 patch onto the skin daily as needed (pain). 03/09/21   [provider]   medroxyPROGESTERone  (PROVERA ) 5 MG tablet Take 2 tablets (10 mg total) by mouth every 8 (eight) hours for 14 days. 01/05/22 01/19/22  Loeffler, Ronnald BROCKS, PA-C  meloxicam (MOBIC) 15 MG tablet Take 15 mg by mouth 2 (two) times daily.    [provider]  oxybutynin (DITROPAN-XL) 10 MG 24 hr tablet Take 5 mg by mouth 2 (two) times daily.    [provider]  pantoprazole (PROTONIX) 40 MG tablet Take 40 mg by mouth at bedtime.    [provider]  REFRESH 1.4-0.6 % SOLN Apply 1 drop to eye daily as needed (dry eyes). 03/26/21   [provider]  traZODone (DESYREL) 100 MG tablet Take 100 mg by mouth at bedtime.    [provider]    Allergies: Buspirone, Flunisolide, Isoniazid, Menthol, Metoclopramide, Quetiapine, and Topiramate    Review of Systems  Updated Vital Signs BP (!) 140/89 (BP Location: Right Arm)   Pulse 92   Temp 98.4 F (36.9 C) (Oral)   Resp 18   Ht 5' 4 (1.626 m)   Wt 106.6 kg   LMP 02/27/2021   SpO2 95%   BMI 40.34 kg/m   Physical Exam  (all labs ordered are listed, but only abnormal results are displayed) Labs Reviewed  COMPREHENSIVE METABOLIC PANEL WITH GFR - Abnormal; Notable for the following components:      Result Value  Total Protein 8.4 (*)    All other components within normal limits  URINALYSIS, ROUTINE W REFLEX MICROSCOPIC - Abnormal; Notable for the following components:   APPearance HAZY (*)    Protein, ur TRACE (*)    Leukocytes,Ua SMALL (*)    All other components within normal limits  LIPASE, BLOOD  CBC  TROPONIN T, HIGH SENSITIVITY  TROPONIN T, HIGH SENSITIVITY    EKG: None  Radiology: DG Chest 2 View Result Date: 03/05/2024 CLINICAL DATA:  Epigastric pain. EXAM: CHEST - 2 VIEW COMPARISON:  January 04, 2024 FINDINGS: The heart size and mediastinal contours are within normal limits. Mild, stable linear scarring and/or atelectasis is seen within the bilateral lung bases. No pleural effusion or  pneumothorax is identified. Radiopaque surgical clips are seen overlying the left upper quadrant. The visualized skeletal structures are unremarkable. IMPRESSION: Mild, stable bibasilar linear scarring and/or atelectasis. Electronically Signed   By: Suzen Dials M.D.   On: 03/05/2024 16:01    {Document cardiac monitor, telemetry assessment procedure when appropriate:32947} Procedures   Medications Ordered in the ED - No data to display    {Click here for ABCD2, HEART and other calculators REFRESH Note before signing:1}                              Medical Decision Making Amount and/or Complexity of Data Reviewed Labs: ordered. Radiology: ordered.   ***  {Document critical care time when appropriate  Document review of labs and clinical decision tools ie [id number], etc  Document your independent review of radiology images and any outside records  Document your discussion with family members, caretakers and with consultants  Document social determinants of health affecting pt's care  Document your decision making why or why not admission, treatments were needed:32947:::1}   Final diagnoses:  None    ED Discharge Orders     None

## 2024-03-05 NOTE — ED Triage Notes (Signed)
 Pt presents c/o epigastric pain that radiates under bilateral breasts, started this morning and has progressively worsened. +N -V. Reports diarrhea x2 days. Hx pancreatitis, gallbladder removal. Denies SHOB.
